# Patient Record
Sex: Female | Born: 1960
Health system: Southern US, Community
[De-identification: ages and names within clinical notes are randomized; demographics above are authoritative.]

## PROBLEM LIST (undated history)

## (undated) DIAGNOSIS — R9431 Abnormal electrocardiogram [ECG] [EKG]: Secondary | ICD-10-CM

## (undated) DIAGNOSIS — K219 Gastro-esophageal reflux disease without esophagitis: Secondary | ICD-10-CM

## (undated) DIAGNOSIS — M8580 Other specified disorders of bone density and structure, unspecified site: Secondary | ICD-10-CM

## (undated) DIAGNOSIS — D1803 Hemangioma of intra-abdominal structures: Secondary | ICD-10-CM

## (undated) DIAGNOSIS — K649 Unspecified hemorrhoids: Secondary | ICD-10-CM

## (undated) DIAGNOSIS — D126 Benign neoplasm of colon, unspecified: Secondary | ICD-10-CM

## (undated) DIAGNOSIS — K625 Hemorrhage of anus and rectum: Secondary | ICD-10-CM

## (undated) DIAGNOSIS — E785 Hyperlipidemia, unspecified: Secondary | ICD-10-CM

## (undated) DIAGNOSIS — T7840XA Allergy, unspecified, initial encounter: Secondary | ICD-10-CM

## (undated) HISTORY — DX: Other specified disorders of bone density and structure, unspecified site: M85.80

## (undated) HISTORY — DX: Hemangioma of intra-abdominal structures: D18.03

## (undated) HISTORY — DX: Hyperlipidemia, unspecified: E78.5

## (undated) HISTORY — PX: TUBAL LIGATION: SHX77

## (undated) HISTORY — DX: Benign neoplasm of colon, unspecified: D12.6

## (undated) HISTORY — DX: Gastro-esophageal reflux disease without esophagitis: K21.9

## (undated) HISTORY — DX: Hemorrhage of anus and rectum: K62.5

## (undated) HISTORY — DX: Allergy, unspecified, initial encounter: T78.40XA

## (undated) HISTORY — DX: Unspecified hemorrhoids: K64.9

## (undated) HISTORY — DX: Abnormal electrocardiogram (ECG) (EKG): R94.31

---

## 2006-04-11 ENCOUNTER — Ambulatory Visit: Payer: Self-pay | Admitting: Internal Medicine

## 2006-04-19 ENCOUNTER — Ambulatory Visit: Payer: Self-pay | Admitting: Internal Medicine

## 2006-10-10 HISTORY — PX: HEMORROIDECTOMY: SUR656

## 2006-10-10 HISTORY — PX: COLONOSCOPY: SHX174

## 2007-07-24 ENCOUNTER — Telehealth: Payer: Self-pay | Admitting: Internal Medicine

## 2007-07-26 ENCOUNTER — Ambulatory Visit: Payer: Self-pay | Admitting: Internal Medicine

## 2007-07-26 DIAGNOSIS — K625 Hemorrhage of anus and rectum: Secondary | ICD-10-CM

## 2007-07-26 DIAGNOSIS — K649 Unspecified hemorrhoids: Secondary | ICD-10-CM

## 2007-07-26 HISTORY — DX: Unspecified hemorrhoids: K64.9

## 2007-07-26 HISTORY — DX: Hemorrhage of anus and rectum: K62.5

## 2007-07-31 ENCOUNTER — Telehealth (INDEPENDENT_AMBULATORY_CARE_PROVIDER_SITE_OTHER): Payer: Self-pay | Admitting: *Deleted

## 2007-08-03 ENCOUNTER — Encounter: Payer: Self-pay | Admitting: Internal Medicine

## 2007-09-04 ENCOUNTER — Ambulatory Visit: Payer: Self-pay | Admitting: Internal Medicine

## 2007-09-04 LAB — CONVERTED CEMR LAB
Crystals: NEGATIVE
Ketones, ur: NEGATIVE mg/dL
Mucus, UA: NEGATIVE
Specific Gravity, Urine: 1.01 (ref 1.000–1.03)
Urine Glucose: NEGATIVE mg/dL
Urobilinogen, UA: 0.2 (ref 0.0–1.0)
WBC, UA: NONE SEEN cells/hpf

## 2007-09-05 ENCOUNTER — Ambulatory Visit: Payer: Self-pay | Admitting: Internal Medicine

## 2007-09-05 ENCOUNTER — Encounter: Payer: Self-pay | Admitting: Internal Medicine

## 2007-09-05 DIAGNOSIS — D126 Benign neoplasm of colon, unspecified: Secondary | ICD-10-CM

## 2007-09-05 HISTORY — DX: Benign neoplasm of colon, unspecified: D12.6

## 2007-09-28 ENCOUNTER — Encounter (INDEPENDENT_AMBULATORY_CARE_PROVIDER_SITE_OTHER): Payer: Self-pay | Admitting: Surgery

## 2007-09-28 ENCOUNTER — Ambulatory Visit (HOSPITAL_COMMUNITY): Admission: RE | Admit: 2007-09-28 | Discharge: 2007-09-28 | Payer: Self-pay | Admitting: Surgery

## 2007-10-31 DIAGNOSIS — D1803 Hemangioma of intra-abdominal structures: Secondary | ICD-10-CM

## 2007-10-31 HISTORY — DX: Hemangioma of intra-abdominal structures: D18.03

## 2011-02-22 NOTE — Op Note (Signed)
NAMEANNETE, AYUSO                 ACCOUNT NO.:  [id number]   MEDICAL RECORD NO.:  [id number]          PATIENT TYPE:  AMB   LOCATION:  SDS                          FACILITY:  [facility]   PHYSICIAN:  Wilmon Arms. Corliss Skains, M.D. DATE OF BIRTH:  [birth year]   DATE OF PROCEDURE:  09/28/2007  DATE OF DISCHARGE:                               OPERATIVE REPORT   PREOPERATIVE DIAGNOSES:  1. External hemorrhoids.  2. Pruritus ani with significant skin excoriation.   POSTOPERATIVE DIAGNOSES:  1. External hemorrhoids.  2. Pruritus ani with significant skin excoriation.   PROCEDURE PERFORMED:  Hemorrhoidectomy, two-column.   SURGEON:  Wilmon Arms. Corliss Skains, M.D., FACS   ANESTHESIA:  General endotracheal.   INDICATIONS:  The patient is a 50 year old female who presents with  several years of significant pruritus and skin irritation around her  anus.  She has had hemorrhoids for many years, which became worse during  pregnancy.  She has had recurrent positive itching and bleeding.  She is  not getting any relief from her topical treatments.  She has had a  colonoscopy, which showed only hemorrhoids.   DESCRIPTION OF PROCEDURE:  The patient was brought to the operating room  and placed in a supine position on the operating table.  After an  adequate level of general anesthesia was obtained, the patient's legs  were placed in the yellow fin stirrups in lithotomy position.  Her  perineum was exposed.  The patient has a significant amount of  excoriation posterior to the anus.  She has a little bit anterior and to  the left of her anus as well.  She has two large, visible external  hemorrhoids.  When is in the right posterior location, the other is in  the left anterior.  Her anus was dilated up to three fingers with a  lubricated glove.  The silver bullet retractor was inserted.  The left  anterior hemorrhoid complex is mostly external hemorrhoid.  However, the  right posterior is an internal-external  complex.  We began on the right.  I placed a suture of 3-0 Vicryl at the apex of the hemorrhoid.  The  mucosa and skin were then incised with a 15 blade.  Blunt dissection was  used to dissect it off of the sphincter muscle.  Cautery was used to  remove the tissue.  Cautery was then used for hemostasis.  I used the 3-  0 Vicryl suture in a running locking fashion to close the mucosa and the  anoderm.  I left the most external 5 mm open to allow drainage.  We then  grasped the left-sided anterior external hemorrhoid with a Pennington  clamp.  I placed a 3-0 Vicryl at the apex.  The anoderm was then incised  with a scalpel.  Cautery was then used to remove this off of the  sphincter muscle.  This was also sent for pathologic examination.  The 3-  0 Vicryl was used to close the anoderm.  I also left an opening to allow  for drainage.  A piece of Gelfoam lubricated with dibucaine ointment was  then inserted into the anal canal.  Hemostasis was good.  The patient was then placed back in the supine  position.  A dry gauze was placed and secured with mesh underwear.  She  was extubated and brought to the recovery room in stable condition.  All  sponge, instrument and needle counts were correct.      Wilmon Arms. Tsuei, M.D.  Electronically Signed     MKT/MEDQ  D:  09/28/2007  T:  09/29/2007  Job:  [id number]

## 2011-02-22 NOTE — Assessment & Plan Note (Signed)
[facility]                         GASTROENTEROLOGY OFFICE NOTE   NAME:Jodi Douglas, Jodi Douglas                          MRN:          [id number]  DATE:09/04/2007                            DOB:          04/02/1961    Jodi Douglas is a 50 year old female who is followed by Dr. Fabian Sharp because  of rectal problems, specifically pruritus, irritation, and a recurrent  yeast infection.  She has been seen by dermatology as well as by Dr.  Fabian Sharp and by her gynecologist.  For years, she has had problems with  rectal irritation and inability to clean well after bowel movements.  Her bowel habits have been regular, 1 bowel movement daily basis, but  she has to use extraordinary care to clean the area round her rectum and  especially in the summer has to use Diflucan on continuous basis to  prevent infections.  She was diagnosed with internal hemorrhoids, and  has had intermittent rectal bleeding.  There is a family history of  colon polyps in a maternal grandmother.  Jodi Douglas had a liver biopsy  in June 2000 in [facility] after abdominal ultrasound showed mass in the  liver.  The aspiration of the mass showed it to be hemangioma.  Her  liver functions have been normal, and she has not had any followup since  that.   MEDICATIONS:  Diflucan currently on hold.  No other medications.   PAST HISTORY:  Significant for C-section.  Operations none.   FAMILY HISTORY:  Positive for colon polyps in grandmother, heart disease  in father.   SOCIAL HISTORY:  Married with 2 children.  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  She does not smoke.  Drinks alcohol  occasionally.   REVIEW OF SYSTEMS:  Essentially negative.   PHYSICAL EXAM:  Blood pressure 100/70, pulse 80 and weight 159.8 pounds.  She appeared healthy, relaxed.  NECK:  Supple without lymphadenopathy.  LUNGS:  Clear to auscultation.  COR:  Normal S1, normal S2.  ABDOMEN:  Soft and nontender with  normoactive bowel sounds.  RECTAL:  On anoscopic exam there were large skin tags at the rectal os,  which were not tender.  Rectal tone was normal.  On anoscopic exam,  there was some bleeding from small mixed hemorrhoids within the anal  canal.  There were also small internal hemorrhoids, which did not bleed  on exam.  They were circumferential.  There was no prolapse of these  hemorrhoids.  There was no particular pain and no fissure found.  Mucosa  of the rectal ampulla was normal.  Stool was Hemoccult negative.  I did  not see any dermatitis around the perianal area as was described on  earlier exam by Dr. Fabian Sharp.  Apparently, the patient had some scaly  pruritic rash around the perianal area, which now has resolved.   IMPRESSION:  1. A 50 year old female with pruritus ani and large external      hemorrhoids with some small internal hemorrhoids, which have been      already treated conservatively with Anusol HC  suppositories.  She      is very uncomfortable with having bowel movements and having to      clean her rectum repeatedly to avoid superinfection.  Apparently,      there is some dermatitis that keeps recurring.  I could not      document it today on my exam.  2. Positive family history of colon polyps.  3. Intermittent rectal bleeding, most likely anal source.   PLAN:  1. Calmoseptine solution to use 2 to 3 times a day.  This is      nonsteroidal containing, and therefore, it would not lead to yeast      superinfection.  2. I think colonoscopy would be needed before considering surgery on      the external hemorrhoids.  I am not sure that they could be taken      out.  She would probably need a surgical consultation for that.      The internal hemorrhoids certainly are not large enough to warrant      banding excision.  3. High fiber diet.  Continued rectal and anal care.  4. Diflucan 100 mg.  I do not see any yeast infection today.  She      feels that it helps her  intermittently, especially in the summer.      We should document the yeast infection with vaginal swabs or rectal      swabs in the future.     Hedwig Morton. Juanda Chance, MD  Electronically Signed    DMB/MedQ  DD: 09/04/2007  DT: 09/04/2007  Job #: [id number]   cc:   Neta Mends. Fabian Sharp, MD

## 2011-02-25 NOTE — Assessment & Plan Note (Signed)
[facility]                              [facility] OFFICE NOTE   NAME:Bohman, Mende                          MRN:          [id number]  DATE:04/19/2006                            DOB:          02-15-1961    CHIEF COMPLAINT:  New patient, Pap, problem with hemorrhoids.   HISTORY OF PRESENT ILLNESS:  Jodi Douglas is a 50 year old ex-smoking, married  white mother of two who comes in today for a first time visit whose previous  care has really been through her GYN, Dr. Arnette Schaumann.  Her mother, Okey Regal  _________ , is a member of our Financial risk analyst.  She has generally been well but  her most problematic thing has been hemorrhoids.  Her GYN has been  following her for this and has had tried a number of different things along  with which she has been getting yeast infections in the perianal area  including itching and irritation.  Last year she was on Diflucan once a  month for up to six months which did help it but after a few months off the  symptoms returned.  Her hemorrhoids are always there, bleed when she wipes  with a tissue at times, but otherwise has no GI symptoms or other systemic  symptoms.   PAST MEDICAL HISTORY:  See database.   SURGERIES:  Had liver biopsy in July of 2007 just recently.  Had evaluation  for abnormal LFTs.  Ultrasound, MRI results showed a hemangioma.  Had been  seeing GI doctor in [facility].  Has not needed to go back, has done quite  well with it.  She is gravida 3, para 2.  Last Pap February 2007.  LMP April 09, 2006.  Last mammogram October 2006.  Unsure when she had last tetanus  shot.  She thinks it has been within 10 years and will check.   MEDICATIONS:  None.   ALLERGIES:  SULFA.   FAMILY HISTORY:  Positive for heart problems in parents, liver cancer in a  grandparent, otherwise noncontributory.   SOCIAL HISTORY:  __________  eight hours of sleep.  She is a Public librarian.  Four years of college.  Two glasses of wine  per week at most.  Used to smoke  when she was a late adolescent, quit at age 30 when she was having children.  Tries to regular exercise.  Does take acidophilus and garlic to try to help  with natural ways to help her hemorrhoids.   REVIEW OF SYSTEMS:  Noted for chest pain, shortness of breath, other GI or  GU problems, otherwise noncontributory.   OBJECTIVE:  VITAL SIGNS:  Height 5 feet 7 inches, weight 156, pulse 66 and  regular, blood pressure 100/60.  GENERAL:  This is a  healthy-appearing middle-aged lady in no acute  distress.  HEENT:  Normocephalic.  TMs are clear.  Eyes:  PERRLA.  Non-icteric.  OP  clear.  Teeth in adequate repair.  NECK:  Without masses, thyromegaly, or bruit.  CHEST:  __________  equal.  CARDIAC:  S1, S2.  No gallops,  murmurs.  Peripheral pulses present without  CCE.  BREASTS:  No nodules or discharge.  Axilla is clear.  ABDOMEN:  Soft.  No organomegaly or guarding.  No rebound.  EXTREMITIES:  Good range of motion.  No focal atrophy.  NEUROLOGIC:  Grossly intact.  RECTAL:  Examination of external anal area shows some perianal erythema  without distinct line and no scaling, but significant white fissuring is  noted in that area along with some very large hemorrhoidal tags without any  associated bleeding present and no specific tenderness or infection or  discharge.   LABORATORIES:  Normal CBC, chemistries.  Cholesterol is 180 with a ratio  3.5.  Liver tests are within normal limits.  TSH is 2.22 and urinalysis  shows just trace protein.   IMPRESSION:  1.  Wellness examination.  In good health overall; she will check on her      tetanus shot.  2.  Hemorrhoidal tags.  3.  Recurrent perianal dermatitis that looks yeast or monilial by      examination today.  We did discuss decreasing caffeine.  Discussed these      two problems how they will aggravate each other, but appear to be      separate.  Will try Diflucan 150 one p.o. every week x3-4, refilled  x3.      Nizoral cream 15 g to use b.i.d. and once subsiding will try maybe      Monistat powder, Burrow's solution if needed for cleaning and drying.      Will also give Anusol H-D suppositories one p.o. b.i.d. #28 for      hemorrhoids.  She will get back with [place] if she is not improving.  4.  History of hemangioma of the liver.  She will try to get [place] those      records so we can review any needed follow-up.                                   Neta Mends. Fabian Sharp, MD   WKP/MedQ  DD:  04/19/2006  DT:  04/20/2006  Job #:  [id number]

## 2011-07-15 LAB — CBC
HCT: 35.1 — ABNORMAL LOW
MCV: 89.6
Platelets: 196
RBC: 3.92
WBC: 6.6

## 2013-05-16 ENCOUNTER — Ambulatory Visit (INDEPENDENT_AMBULATORY_CARE_PROVIDER_SITE_OTHER): Payer: Managed Care, Other (non HMO) | Admitting: Family Medicine

## 2013-05-16 ENCOUNTER — Encounter: Payer: Self-pay | Admitting: Family Medicine

## 2013-05-16 VITALS — BP 112/84 | Temp 98.0°F | Ht 66.25 in | Wt 167.0 lb

## 2013-05-16 DIAGNOSIS — M949 Disorder of cartilage, unspecified: Secondary | ICD-10-CM

## 2013-05-16 DIAGNOSIS — R9431 Abnormal electrocardiogram [ECG] [EKG]: Secondary | ICD-10-CM

## 2013-05-16 DIAGNOSIS — Z7689 Persons encountering health services in other specified circumstances: Secondary | ICD-10-CM

## 2013-05-16 DIAGNOSIS — E785 Hyperlipidemia, unspecified: Secondary | ICD-10-CM | POA: Insufficient documentation

## 2013-05-16 DIAGNOSIS — M8580 Other specified disorders of bone density and structure, unspecified site: Secondary | ICD-10-CM

## 2013-05-16 DIAGNOSIS — M899 Disorder of bone, unspecified: Secondary | ICD-10-CM

## 2013-05-16 DIAGNOSIS — Z7189 Other specified counseling: Secondary | ICD-10-CM

## 2013-05-16 NOTE — Progress Notes (Signed)
Chief Complaint  Patient presents with  . Establish Care    HPI:  Jodi Douglas is here to establish care. Here to re-establish care. Last PCP and physical: Dr. Delford Field in [place] for gyn - had physical last year. Reports UTD on paps. Reports borderline cholesterol problems in the past treated with lifestyle recs. Reports had bone density at [facility] 05/2012 - not sure why - told had osteopenia and put on medication for this but she stopped it. Reports hx of abnormal EKG at physical - not sure what, had echo and stress test which were all normal. Denies any symptoms of CP, SOB, DOE, sudden cardiac death in family, Father had some heart disease later in life.  Has the following chronic problems and concerns today:  Patient Active Problem List   Diagnosis Date Noted  . Osteopenia 05/16/2013  . Nonspecific abnormal electrocardiogram (ECG) (EKG) 05/16/2013  . Hyperlipidemia 05/16/2013   Health Maintenance: -had colonoscopy in 2008 with benign polyp and thinks was due in 5 years - she will call to see when needs repeat -thinks last tetanus > 5 years -reports UTD on pap ROS: See pertinent positives and negatives per HPI.  Past Medical History  Diagnosis Date  . COLONIC POLYPS, HYPERPLASTIC 09/05/2007    Qualifier: Diagnosis of  By: Gwinda Passe RN, Carissa    . HEMORRHOIDS 07/26/2007    Qualifier: Diagnosis of  By: Fabian Sharp MD, Neta Mends   . LIVER HEMANGIOMA 10/31/2007    Qualifier: Diagnosis of  By: Gwinda Passe RN, Cloria Spring    . RECTAL BLEEDING 07/26/2007    Qualifier: Diagnosis of  By: Fabian Sharp MD, Neta Mends   . Osteopenia   . Hyperlipidemia     Family History  Problem Relation Age of Onset  . Heart disease Father   . Arthritis Sister   . Cancer Maternal Grandmother     liver cancer  . Heart disease Paternal Grandfather     History   Social History  . Marital Status: Married    Spouse Name: N/A    Number of Children: N/A  . Years of Education: N/A   Social History Main Topics   . Smoking status: Former Smoker    Types: Cigarettes    Quit date: 10/10/1994  . Smokeless tobacco: None     Comment: remote smoker  . Alcohol Use: Yes     Comment: occ; 2-3 drinks once per week  . Drug Use: None  . Sexually Active: None   Other Topics Concern  . None   Social History Narrative   Work or School: Marine scientist Situation: lives with husband and two children      Spiritual Beliefs: Christian      Lifestyle: jogs or walks 5 days per week; diet is fair - she is working on diet and trying to loose weight             No current outpatient prescriptions on file.  EXAMCeasar Mons Vitals:   05/16/13 1435  BP: 112/84  Temp: 98 F (36.7 C)    Body mass index is 26.74 kg/(m^2).  GENERAL: vitals reviewed and listed above, alert, oriented, appears well hydrated and in no acute distress  HEENT: atraumatic, conjunttiva clear, no obvious abnormalities on inspection of external nose and ears  NECK: no obvious masses on inspection  LUNGS: clear to auscultation bilaterally, no wheezes, rales or rhonchi, good air movement  CV: HRRR, no peripheral edema  MS: moves  all extremities without noticeable abnormality  PSYCH: pleasant and cooperative, no obvious depression or anxiety  ASSESSMENT AND PLAN:  Discussed the following assessment and plan:  Osteopenia -obtain records, vit D, calcium and weight bearing exercise advised, repeat dexa in 2 years  Hyperlipidemia -fasting labs at physical, healthy lifestyle advised  Nonspecific abnormal electrocardiogram (ECG) (EKG) -obtain records  Encounter to establish care  -We reviewed the PMH, PSH, FH, SH, Meds and Allergies. -We provided refills for any medications we will prescribe as needed. -We addressed current concerns per orders and patient instructions. -We have asked for records for pertinent exams, studies, vaccines and notes from previous providers. -We have advised patient to follow up per  instructions below.   -Patient advised to return or notify a doctor immediately if symptoms worsen or persist or new concerns arise.  Patient Instructions  -PLEASE SIGN UP FOR MYCHART TODAY   We recommend the following healthy lifestyle measures: - eat a healthy diet consisting of lots of vegetables, fruits, beans, nuts, seeds, healthy meats such as white chicken and fish and whole grains.  - avoid fried foods, fast food, processed foods, sodas, red meet and other fattening foods.  - get a least 150 minutes of aerobic exercise per week.   -Call about your colonoscopy ([facility] - Dr. Dickie La)   -please have mammogram results sent to [place]  -please have last pap results sent to [place]  -please have vaccine records sent to [place]  -take 1000-2000 IU of Vit D 3 daily and a total of 1200mg  of calcium in food and supplement if needed  Follow up in: follow up in 1-2 months for physical in the morning with labs and pap      Montey Ebel R.

## 2013-05-16 NOTE — Patient Instructions (Addendum)
-  PLEASE SIGN UP FOR MYCHART TODAY   We recommend the following healthy lifestyle measures: - eat a healthy diet consisting of lots of vegetables, fruits, beans, nuts, seeds, healthy meats such as white chicken and fish and whole grains.  - avoid fried foods, fast food, processed foods, sodas, red meet and other fattening foods.  - get a least 150 minutes of aerobic exercise per week.   -Call about your colonoscopy ([facility] - Dr. Dickie La)   -please have mammogram results sent to [place]  -please have last pap results sent to [place]  -please have vaccine records sent to [place]  -take 1000-2000 IU of Vit D 3 daily and a total of 1200mg  of calcium in food and supplement if needed  Follow up in: follow up in 1-2 months for physical in the morning with labs and pap

## 2013-06-20 ENCOUNTER — Encounter: Payer: Self-pay | Admitting: Family Medicine

## 2013-06-20 ENCOUNTER — Other Ambulatory Visit (HOSPITAL_COMMUNITY)
Admission: RE | Admit: 2013-06-20 | Discharge: 2013-06-20 | Disposition: A | Discharge status: Home / Self Care | Payer: Managed Care, Other (non HMO) | Source: Ambulatory Visit | Attending: Family Medicine | Admitting: Family Medicine

## 2013-06-20 ENCOUNTER — Ambulatory Visit (INDEPENDENT_AMBULATORY_CARE_PROVIDER_SITE_OTHER): Payer: Managed Care, Other (non HMO) | Admitting: Family Medicine

## 2013-06-20 VITALS — BP 112/78 | Temp 98.4°F | Ht 66.25 in | Wt 168.0 lb

## 2013-06-20 DIAGNOSIS — Z01419 Encounter for gynecological examination (general) (routine) without abnormal findings: Secondary | ICD-10-CM | POA: Insufficient documentation

## 2013-06-20 DIAGNOSIS — M899 Disorder of bone, unspecified: Secondary | ICD-10-CM

## 2013-06-20 DIAGNOSIS — Z23 Encounter for immunization: Secondary | ICD-10-CM

## 2013-06-20 DIAGNOSIS — M8580 Other specified disorders of bone density and structure, unspecified site: Secondary | ICD-10-CM

## 2013-06-20 DIAGNOSIS — Z1151 Encounter for screening for human papillomavirus (HPV): Secondary | ICD-10-CM | POA: Insufficient documentation

## 2013-06-20 DIAGNOSIS — Z0000 Encounter for general adult medical examination without abnormal findings: Secondary | ICD-10-CM

## 2013-06-20 DIAGNOSIS — K649 Unspecified hemorrhoids: Secondary | ICD-10-CM

## 2013-06-20 DIAGNOSIS — E785 Hyperlipidemia, unspecified: Secondary | ICD-10-CM

## 2013-06-20 LAB — LIPID PANEL
Cholesterol: 182 mg/dL (ref 0–200)
HDL: 52.9 mg/dL (ref >39.00)
Triglycerides: 76 mg/dL (ref 0.0–149.0)

## 2013-06-20 MED ORDER — LIDOCAINE-HYDROCORTISONE ACE 3-1 % RE KIT: 1.0000 "application " | PACK | Freq: Two times a day (BID) | RECTAL | Status: DC

## 2013-06-20 MED ORDER — NYSTATIN-TRIAMCINOLONE 100000-0.1 UNIT/GM-% EX OINT: 1.0000 "application " | TOPICAL_OINTMENT | Freq: Two times a day (BID) | CUTANEOUS | Status: DC

## 2013-06-20 NOTE — Addendum Note (Signed)
Addended by: Earle Gell C on: 06/20/2013 09:00 AM   Modules accepted: Orders

## 2013-06-20 NOTE — Progress Notes (Signed)
Chief Complaint  Patient presents with  . Annual Exam    HPI:  Here for CPE:  -Concerns/Chrinic Problems:  1)Osteopenia: -advised cal, vit D, exercise -last [facility] 05/2012 per pt  2)HLD: -FASTING labs today to evaluate  3)hemorrhoids -uses lidocaine 3% -hydrocortisone 1% cream; needs refill -occ gets yeast infection and needs diflucan -has had hemorroidectomy -still has occ issues  -Diet: variety of foods, balance and well rounded  -Vitamin D/Calcium: advised to take  -Exercise:  regular exercise - walking and jogging 5 days per week  -Diabetes and Dyslipidemia Screening: doing today  -Hx of HTN: no  -Vaccines: needs flu and tdap today today  -pap history: unsure of last one, no hx of abnormal pap  -FDLMP: postmenopausal, no bleeding  -sexual activity: yes, female partner, no new partners  -wants STI testing and hep C: decline  -FH breast, colon or ovarian ca: see FH -last mammo: 10/2012 -last colon cancer screening: colonoscopy 2008 with polyp and recs to repeat in 5 years - reports she called GI and told to get next year  -Alcohol, Tobacco, drug use: see social history  Review of Systems - Review of Systems  Constitutional: Negative for fever, weight loss and malaise/fatigue.  HENT: Negative for hearing loss.   Eyes: Negative for blurred vision.  Respiratory: Negative for shortness of breath.   Cardiovascular: Negative for chest pain, palpitations and leg swelling.  Gastrointestinal: Negative for diarrhea, constipation, blood in stool and melena.  Genitourinary: Negative for dysuria.  Musculoskeletal: Negative for joint pain and falls.  Skin: Negative for rash.  Neurological: Negative for dizziness.  Endo/Heme/Allergies: Does not bruise/bleed easily.  Psychiatric/Behavioral: Negative for depression and suicidal ideas.     Past Medical History  Diagnosis Date  . COLONIC POLYPS, HYPERPLASTIC 09/05/2007    Qualifier: Diagnosis of  By:  Gwinda Passe RN, Carissa    . HEMORRHOIDS 07/26/2007    Qualifier: Diagnosis of  By: Fabian Sharp MD, Neta Mends   . LIVER HEMANGIOMA 10/31/2007    Qualifier: Diagnosis of  By: Gwinda Passe RN, Cloria Spring    . RECTAL BLEEDING 07/26/2007    Qualifier: Diagnosis of  By: Fabian Sharp MD, Neta Mends   . Osteopenia   . Hyperlipidemia     Past Surgical History  Procedure Laterality Date  . Tubal ligation    . Cesarean section      Family History  Problem Relation Age of Onset  . Heart disease Father   . Arthritis Sister   . Cancer Maternal Grandmother     liver cancer  . Heart disease Paternal Grandfather     History   Social History  . Marital Status: Married    Spouse Name: N/A    Number of Children: N/A  . Years of Education: N/A   Social History Main Topics  . Smoking status: Former Smoker    Types: Cigarettes    Quit date: 10/10/1994  . Smokeless tobacco: None     Comment: remote smoker  . Alcohol Use: Yes     Comment: occ; 2-3 drinks once per week  . Drug Use: None  . Sexual Activity: None   Other Topics Concern  . None   Social History Narrative   Work or School: Marine scientist Situation: lives with husband and two children      Spiritual Beliefs: Christian      Lifestyle: jogs or walks 5 days per week; diet is fair - she is working on diet  and trying to loose weight             Current outpatient prescriptions:lidocaine-hydrocortisone (ANAMANTLE) 3-1 % KIT, Place 1 application rectally 2 (two) times daily., Disp: 1 each, Rfl: 1;  nystatin-triamcinolone ointment (MYCOLOG), Apply 1 application topically 2 (two) times daily., Disp: 30 g, Rfl: 1  EXAM:  Filed Vitals:   06/20/13 0812  BP: 112/78  Temp: 98.4 F (36.9 C)    GENERAL: vitals reviewed and listed below, alert, oriented, appears well hydrated and in no acute distress  HEENT: head atraumatic, PERRLA, normal appearance of eyes, ears, nose and mouth. moist mucus membranes.  NECK: supple, no masses or  lymphadenopathy  LUNGS: clear to auscultation bilaterally, no rales, rhonchi or wheeze  CV: HRRR, no peripheral edema or cyanosis, normal pedal pulses  BREAST: normal appearance - no lesions or discharge, on palpation normal breast tissue without any suspicious masses  ABDOMEN: bowel sounds normal, soft, non tender to palpation, no masses, no rebound or guarding  GU: normal appearance of external genitalia - no lesions or masses, normal vaginal mucosa - no abnormal discharge, normal appearance of cervix - no lesions or abnormal discharge, no masses or tenderness on palpation of uterus and ovaries.  RECTAL: refused  SKIN: no rash or abnormal lesions  MS: normal gait, moves all extremities normally  NEURO: CN II-XII grossly intact, normal muscle strength and sensation to light touch on extremities  PSYCH: normal affect, pleasant and cooperative  ASSESSMENT AND PLAN:  Discussed the following assessment and plan:  Visit for preventive health examination - Plan: Lipid Panel, Hemoglobin A1c  Hemorrhoids  Osteopenia  Hyperlipidemia - Plan: Lipid Panel   -Discussed and advised all [place] preventive services health task force level A and B recommendations for age, sex and risks.  -Advised at least 150 minutes of exercise per week and a healthy diet low in saturated fats and sweets and consisting of fresh fruits and vegetables, lean meats such as fish and white chicken and whole grains. Advised weight bearing exercise.  -refilled meds  -tdap, flu given today  -FASTING LABS  -labs, studies and vaccines per orders this encounter  Orders Placed This Encounter  Procedures  . Lipid Panel  . Hemoglobin A1c    Patient Instructions  -1000 IU of vit D 3 daily, 1200mg  of calcium total from food and supplement  We recommend the following healthy lifestyle measures: - eat a healthy diet consisting of lots of vegetables, fruits, beans, nuts, seeds, healthy meats such as white chicken  and fish and whole grains.  - avoid fried foods, fast food, processed foods, sodas, red meet and other fattening foods.  - get a least 150 minutes of aerobic exercise per week.   -We have ordered labs or studies at this visit. It can take up to 1-2 weeks for results and processing. We will contact you with instructions IF your results are abnormal. Normal results will be released to your [facility]. If you have not heard from [place] or can not find your results in [facility] in 2 weeks please contact our office.           Patient advised to return to clinic immediately if symptoms worsen or persist or new concerns.    Return in about 1 year (around 06/20/2014), or if symptoms worsen or fail to improve.  Kriste Basque R.

## 2013-06-20 NOTE — Patient Instructions (Addendum)
-  1000 IU of vit D 3 daily, 1200mg  of calcium total from food and supplement  We recommend the following healthy lifestyle measures: - eat a healthy diet consisting of lots of vegetables, fruits, beans, nuts, seeds, healthy meats such as white chicken and fish and whole grains.  - avoid fried foods, fast food, processed foods, sodas, red meet and other fattening foods.  - get a least 150 minutes of aerobic exercise per week.   -We have ordered labs or studies at this visit. It can take up to 1-2 weeks for results and processing. We will contact you with instructions IF your results are abnormal. Normal results will be released to your [facility]. If you have not heard from [place] or can not find your results in [facility] in 2 weeks please contact our office.

## 2013-06-21 ENCOUNTER — Encounter: Payer: Self-pay | Admitting: Family Medicine

## 2013-07-04 ENCOUNTER — Encounter: Payer: Self-pay | Admitting: Family Medicine

## 2013-07-04 NOTE — Progress Notes (Signed)
Received echo and nuclear cardiac reports from 2013 from [facility]. Placed in scan box.

## 2013-07-16 ENCOUNTER — Encounter: Payer: Self-pay | Admitting: Family Medicine

## 2013-07-17 ENCOUNTER — Encounter: Payer: Self-pay | Admitting: Family Medicine

## 2014-06-18 ENCOUNTER — Encounter: Payer: Self-pay | Admitting: Internal Medicine

## 2014-07-16 ENCOUNTER — Ambulatory Visit (INDEPENDENT_AMBULATORY_CARE_PROVIDER_SITE_OTHER): Payer: Managed Care, Other (non HMO) | Admitting: Family Medicine

## 2014-07-16 ENCOUNTER — Ambulatory Visit: Payer: Managed Care, Other (non HMO) | Admitting: Family Medicine

## 2014-07-16 ENCOUNTER — Encounter: Payer: Self-pay | Admitting: Family Medicine

## 2014-07-16 VITALS — BP 100/74 | HR 88 | Temp 97.8°F | Ht 66.25 in | Wt 177.5 lb

## 2014-07-16 DIAGNOSIS — M25562 Pain in left knee: Secondary | ICD-10-CM

## 2014-07-16 NOTE — Progress Notes (Signed)
Pre visit review using our clinic review tool, if applicable. No additional management support is needed unless otherwise documented below in the visit note. 

## 2014-07-16 NOTE — Patient Instructions (Addendum)
-  please do the exercises provided at least 4-5 days per week  -no strenuous lower body exercise for the next few weeks - walking is ok if not aggravating symptoms --> then GRADUALLY add back gentle stationary bike or walking  -can you aleve twice daily if needed for pain  -ice after any activity that causes pain  -follow up in 3-4 weeks

## 2014-07-16 NOTE — Progress Notes (Signed)
No chief complaint on file.   HPI:  Acute visit for:  L knee Pain: -started a month or two ago after doing a lot of gym and elliptical workouts and walking [place] the beach -has been coming and going since then, had flare 2 weeks ago after twisting when running to mailbox - felt a pop and immediate pain - no swelling -she reports she could not bear weight, she went to urgent care and had neg xray, treated with a steroid -she reports has been doing gentle biking, improved -pain is around the knee cap, mild pain now, constant, worse with stairs, squatting -meloxicam helps -denies: weakness, numbness, popping or giving away, fevers, malaise  ROS: See pertinent positives and negatives per HPI.  Past Medical History  Diagnosis Date  . COLONIC POLYPS, HYPERPLASTIC 09/05/2007    Qualifier: Diagnosis of  By: Arsenio Loader RN, Carissa    . HEMORRHOIDS 07/26/2007    Qualifier: Diagnosis of  By: Regis Bill MD, Standley Brooking   . LIVER HEMANGIOMA 10/31/2007    Qualifier: Diagnosis of  By: Arsenio Loader RN, Ramond Craver    . RECTAL BLEEDING 07/26/2007    Qualifier: Diagnosis of  By: Regis Bill MD, Standley Brooking   . Osteopenia   . Hyperlipidemia     Past Surgical History  Procedure Laterality Date  . Tubal ligation    . Cesarean section      Family History  Problem Relation Age of Onset  . Heart disease Father   . Arthritis Sister   . Cancer Maternal Grandmother     liver cancer  . Heart disease Paternal Grandfather     History   Social History  . Marital Status: Married    Spouse Name: N/A    Number of Children: N/A  . Years of Education: N/A   Social History Main Topics  . Smoking status: Former Smoker    Types: Cigarettes    Quit date: 10/10/1994  . Smokeless tobacco: None     Comment: remote smoker  . Alcohol Use: Yes     Comment: occ; 2-3 drinks once per week  . Drug Use: None  . Sexual Activity: None   Other Topics Concern  . 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  GENERAL: vitals reviewed and listed above, alert, oriented, appears well hydrated and in no acute distress  HEENT: atraumatic, conjunttiva clear, no obvious abnormalities on inspection of external nose and ears  NECK: no obvious masses on inspection  LUNGS: clear to auscultation bilaterally, no wheezes, rales or rhonchi, good air movement  CV: HRRR, no peripheral edema  MS: moves all extremities without noticeable abnormality -gait is normal -no swelling, erythema or obvious deformity of either knee on inspection -on L knee exam, +pat crepitus, + J sign, +TTP around knee cap and medial jt line mild, +single leg squat, some popping with mcmurry but no  pain -neg lachman, neg ant/post drawer, neg val/var stress -NV intact distally  PSYCH: pleasant and cooperative, no obvious depression or anxiety  ASSESSMENT AND PLAN:  Discussed the following assessment and plan:  Left knee pain  -we discussed possible serious and likely etiologies, workup and treatment, treatment risks and return precautions - likely PFS and possible meniscal injury -after this discussion, Taylee opted for HEP, conservative care and close follow up - advised MRI or specialist referral if not improving -follow up advised in 3-4 weeks -of course, we advised Anjelica  to return or notify a doctor immediately if symptoms worsen or persist or new concerns arise.  .  -Patient advised to return or notify a doctor immediately if symptoms worsen or persist or new concerns arise.  Patient Instructions   -please do the exercises provided at least 4-5 days per week  -no strenuous lower body exercise for the next few weeks - walking is ok if not aggravating symptoms --> then GRADUALLY add back gentle stationary bike or walking  -can you aleve twice daily if needed for pain  -ice after any activity that causes pain  -follow up in 3-4 weeks     KIM, HANNAH R.

## 2014-08-07 ENCOUNTER — Encounter: Payer: Self-pay | Admitting: Family Medicine

## 2014-08-07 ENCOUNTER — Ambulatory Visit (INDEPENDENT_AMBULATORY_CARE_PROVIDER_SITE_OTHER): Payer: Managed Care, Other (non HMO) | Admitting: Family Medicine

## 2014-08-07 VITALS — BP 108/82 | HR 65 | Temp 97.9°F | Ht 66.25 in | Wt 174.5 lb

## 2014-08-07 DIAGNOSIS — M25562 Pain in left knee: Secondary | ICD-10-CM

## 2014-08-07 NOTE — Progress Notes (Signed)
Pre visit review using our clinic review tool, if applicable. No additional management support is needed unless otherwise documented below in the visit note. 

## 2014-08-07 NOTE — Patient Instructions (Signed)
Continue the exercises at least 3-4 days per week  Gradually add back exercise  Return as needed for the knee injury

## 2014-08-07 NOTE — Progress Notes (Signed)
No chief complaint on file.   HPI:  Follow up:  L knee pain: Started about 6 weeks ago after injury running to mailbox and after increasing exercise Suspected PFS and possible meniscal injury and she opted for trial conservative tx Reports: feels like exercises have really helped, easing back into activity - did 2 miles yesterday and did fine Denies: weakness, giving away, swelling, weakness  ROS: See pertinent positives and negatives per HPI.  Past Medical History  Diagnosis Date  . COLONIC POLYPS, HYPERPLASTIC 09/05/2007    Qualifier: Diagnosis of  By: Arsenio Loader RN, Carissa    . HEMORRHOIDS 07/26/2007    Qualifier: Diagnosis of  By: Regis Bill MD, Standley Brooking   . LIVER HEMANGIOMA 10/31/2007    Qualifier: Diagnosis of  By: Arsenio Loader RN, Ramond Craver    . RECTAL BLEEDING 07/26/2007    Qualifier: Diagnosis of  By: Regis Bill MD, Standley Brooking   . Osteopenia   . Hyperlipidemia     Past Surgical History  Procedure Laterality Date  . Tubal ligation    . Cesarean section      Family History  Problem Relation Age of Onset  . Heart disease Father   . Arthritis Sister   . Cancer Maternal Grandmother     liver cancer  . Heart disease Paternal Grandfather     History   Social History  . Marital Status: Married    Spouse Name: N/A    Number of Children: N/A  . Years of Education: N/A   Social History Main Topics  . Smoking status: Former Smoker    Types: Cigarettes    Quit date: 10/10/1994  . Smokeless tobacco: None     Comment: remote smoker  . Alcohol Use: Yes     Comment: occ; 2-3 drinks once per week  . Drug Use: None  . Sexual Activity: None   Other Topics Concern  . 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 , Disp: , Rfl:   EXAM:  Filed Vitals:   08/07/14 1300  BP: 108/82  Pulse: 65  Temp: 97.9 F (36.6 C)    Body mass index is 27.94 kg/(m^2).  GENERAL: vitals reviewed and listed above, alert, oriented, appears well hydrated and in no acute distress  HEENT: atraumatic, conjunttiva clear, no obvious abnormalities on inspection of external nose and ears  NECK: no obvious masses on inspection  KNEE: L knee without effusion or swelling, no TTP, single leg squat improving  MS: moves all extremities without noticeable abnormality  PSYCH: pleasant and cooperative, no obvious depression or anxiety  ASSESSMENT AND PLAN:  Discussed the following assessment and plan:  Left knee pain  -doing much better -continue HEP, follow up as needed -Patient advised to return or notify a doctor immediately if symptoms worsen or persist or new concerns arise.  There are no Patient Instructions on file for this visit.   Colin Benton R.

## 2014-09-10 ENCOUNTER — Encounter: Payer: Self-pay | Admitting: Family Medicine

## 2014-09-10 ENCOUNTER — Ambulatory Visit (INDEPENDENT_AMBULATORY_CARE_PROVIDER_SITE_OTHER): Payer: Managed Care, Other (non HMO) | Admitting: Family Medicine

## 2014-09-10 VITALS — BP 102/76 | HR 73 | Temp 97.9°F | Ht 67.75 in | Wt 172.5 lb

## 2014-09-10 DIAGNOSIS — Z0000 Encounter for general adult medical examination without abnormal findings: Secondary | ICD-10-CM

## 2014-09-10 LAB — HEMOGLOBIN A1C: Hgb A1c MFr Bld: 5.8 % (ref 4.6–6.5)

## 2014-09-10 NOTE — Progress Notes (Signed)
HPI:  Here for CPE:  -Concerns and/or follow up today: none  She reports she is doing well. We did receive multiple prior records which we reviewed. She reports hx of incidental finding of abnormal EKG in the past at a physical and eval with cardiologist with echo and myocardial perfusion scan. Reports cardiologist informed her everything looked ok and she doe snot take any medications for her heart. She denies current or past hx of CP, SOB, DOE, chest discomfort with activity, palpitations, exercise intolerance. On review of records normal echo, mild abnormality on myocardial perfusion scan.  -Diet: variety of foods, balance and well rounded  -Exercise: regular exercise - 3-4 miles walking 3-5 days per week and goes to the gym on saturdays  -vitamin D or calcium:  no  -Diabetes and Dyslipidemia Screening: done last year with very mild prediabetes and mildly elevated LDL  -Hx of HTN: no  -Vaccines: UTD  -pap history: normal pap last year  -FDLMP: n/a  -sexual activity: yes, female partner, no new partners  -wants STI testing: no  -FH breast, colon or ovarian ca: see FH Last mammogram: last year Last colon cancer screening: 2008, recs per letter from GI reviewed in chart that she is to repeat in 2018  -Alcohol, Tobacco, drug use: see social history  Review of Systems - no fevers, unintentional weight loss, vision loss, hearing loss, chest pain, sob, hemoptysis, melena, hematochezia, hematuria, genital discharge, changing or concerning skin lesions, bleeding, bruising, loc, thoughts of self harm or SI  Past Medical History  Diagnosis Date  . COLONIC POLYPS, HYPERPLASTIC 09/05/2007    Qualifier: Diagnosis of  By: Arsenio Loader RN, Carissa    . HEMORRHOIDS 07/26/2007    Qualifier: Diagnosis of  By: Regis Bill MD, Standley Brooking   . LIVER HEMANGIOMA 10/31/2007    Qualifier: Diagnosis of  By: Arsenio Loader RN, Ramond Craver    . RECTAL BLEEDING 07/26/2007    Qualifier: Diagnosis of  By: Regis Bill MD, Standley Brooking    . Osteopenia   . Hyperlipidemia     Past Surgical History  Procedure Laterality Date  . Tubal ligation    . Cesarean section      Family History  Problem Relation Age of Onset  . Heart disease Father   . Arthritis Sister   . Cancer Maternal Grandmother     liver cancer  . Heart disease Paternal Grandfather     History   Social History  . Marital Status: Married    Spouse Name: N/A    Number of Children: N/A  . Years of Education: N/A   Social History Main Topics  . Smoking status: Former Smoker    Types: Cigarettes    Quit date: 10/10/1994  . Smokeless tobacco: None     Comment: remote smoker  . Alcohol Use: Yes     Comment: occ; 2-3 drinks once per week  . Drug Use: None  . Sexual Activity: None   Other Topics Concern  . 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 , Disp: , Rfl:  EXAM:  Filed Vitals:   09/10/14 0931  BP: 102/76  Pulse: 73  Temp: 97.9 F (36.6 C)    GENERAL: vitals reviewed and listed below, alert, oriented, appears well hydrated and in no acute distress  HEENT: head atraumatic, PERRLA, normal appearance of eyes, ears, nose and mouth. moist mucus membranes.  NECK: supple, no masses or lymphadenopathy  LUNGS: clear to auscultation bilaterally, no rales, rhonchi or wheeze  CV: HRRR, no peripheral edema or cyanosis, normal pedal pulses  BREAST: normal appearance - no lesions or discharge, on palpation normal breast tissue without any suspicious masses  ABDOMEN: bowel sounds normal, soft, non tender to palpation, no masses, no rebound or guarding  GU: normal appearance of external genitalia - no lesions or  masses, normal vaginal mucosa - no abnormal discharge, normal appearance of cervix - no lesions or abnormal discharge, no masses or tenderness on palpation of uterus and ovaries.  RECTAL: refused  SKIN: no rash or abnormal lesions  MS: normal gait, moves all extremities normally  NEURO: CN II-XII grossly intact, normal muscle strength and sensation to light touch on extremities  PSYCH: normal affect, pleasant and cooperative  ASSESSMENT AND PLAN:  Discussed the following assessment and plan:  There are no diagnoses linked to this encounter.  -Discussed and advised all [place] preventive services health task force level A and B recommendations for age, sex and risks.  -discussed importance of lowering CV risks, healthy diet and regular exercise and monitoring lipids, BP and diabetes screening. Labs today. Discussed risks/benefits asa and she may consider baby asa daily.   -Advised at least 150 minutes of exercise per week and a healthy diet low in saturated fats and sweets and consisting of fresh fruits and vegetables, lean meats such as fish and white chicken and whole grains.  -FASTING labs, studies and vaccines per orders this encounter  Orders Placed This Encounter  Procedures  . Lipid Panel  . Hemoglobin A1c    Patient advised to return to clinic immediately if symptoms worsen or persist or new concerns.  Patient Instructions  -[phone] IU of Vit 3 daily and adequate calcium in diet (1200mg  daily)  -mammogram yearly  We recommend the following healthy lifestyle measures: - eat a healthy diet consisting of lots of vegetables, fruits, beans, nuts, seeds, healthy meats such as white chicken and fish and whole grains.  - avoid fried foods, fast food, processed foods, sodas, red meet and other fattening foods.  - get a least 150 minutes of aerobic exercise per week.      No Follow-up on file.  Colin Benton R.

## 2014-09-10 NOTE — Patient Instructions (Signed)
-  [phone] IU of Vit 3 daily and adequate calcium in diet (1200mg  daily)  -mammogram yearly  We recommend the following healthy lifestyle measures: - eat a healthy diet consisting of lots of vegetables, fruits, beans, nuts, seeds, healthy meats such as white chicken and fish and whole grains.  - avoid fried foods, fast food, processed foods, sodas, red meet and other fattening foods.  - get a least 150 minutes of aerobic exercise per week.

## 2014-09-10 NOTE — Progress Notes (Signed)
Pre visit review using our clinic review tool, if applicable. No additional management support is needed unless otherwise documented below in the visit note. 

## 2014-09-11 LAB — LIPID PANEL
Cholesterol: 219 mg/dL — ABNORMAL HIGH (ref 0–200)
HDL: 51.3 mg/dL (ref >39.00)
LDL Cholesterol: 154 mg/dL — ABNORMAL HIGH (ref 0–99)
NONHDL: 167.7
TRIGLYCERIDES: 70 mg/dL (ref 0.0–149.0)
Total CHOL/HDL Ratio: 4
VLDL: 14 mg/dL (ref 0.0–40.0)

## 2014-12-24 ENCOUNTER — Encounter: Payer: Self-pay | Admitting: Family Medicine

## 2014-12-24 ENCOUNTER — Ambulatory Visit (INDEPENDENT_AMBULATORY_CARE_PROVIDER_SITE_OTHER): Payer: BLUE CROSS/BLUE SHIELD | Admitting: Family Medicine

## 2014-12-24 VITALS — BP 104/80 | HR 69 | Temp 97.9°F | Ht 67.75 in | Wt 166.2 lb

## 2014-12-24 DIAGNOSIS — L609 Nail disorder, unspecified: Secondary | ICD-10-CM

## 2014-12-24 DIAGNOSIS — E785 Hyperlipidemia, unspecified: Secondary | ICD-10-CM

## 2014-12-24 DIAGNOSIS — E663 Overweight: Secondary | ICD-10-CM

## 2014-12-24 DIAGNOSIS — L608 Other nail disorders: Secondary | ICD-10-CM

## 2014-12-24 LAB — LIPID PANEL
CHOL/HDL RATIO: 3
Cholesterol: 179 mg/dL (ref 0–200)
HDL: 56.5 mg/dL (ref >39.00)
LDL Cholesterol: 110 mg/dL — ABNORMAL HIGH (ref 0–99)
NonHDL: 122.5
Triglycerides: 65 mg/dL (ref 0.0–149.0)
VLDL: 13 mg/dL (ref 0.0–40.0)

## 2014-12-24 NOTE — Progress Notes (Signed)
HPI:  HM: -hiv  HLD/Prediabetes: -advised lifestyle recs and repeat lipids today -going to the gym and eating healthy - on 56 day plan -denies: intol to statin, polyuira, polydipsia  Friable tonail: -R great toenail deformity for a few months -denies other nail or skin lesions   ROS: See pertinent positives and negatives per HPI.  Past Medical History  Diagnosis Date  . COLONIC POLYPS, HYPERPLASTIC 09/05/2007    Qualifier: Diagnosis of  By: Arsenio Loader RN, Carissa    . HEMORRHOIDS 07/26/2007    Qualifier: Diagnosis of  By: Regis Bill MD, Standley Brooking   . LIVER HEMANGIOMA 10/31/2007    Qualifier: Diagnosis of  By: Arsenio Loader RN, Ramond Craver    . RECTAL BLEEDING 07/26/2007    Qualifier: Diagnosis of  By: Regis Bill MD, Standley Brooking   . Osteopenia   . Hyperlipidemia     Past Surgical History  Procedure Laterality Date  . Tubal ligation    . Cesarean section      Family History  Problem Relation Age of Onset  . Heart disease Father   . Arthritis Sister   . Cancer Maternal Grandmother     liver cancer  . Heart disease Paternal Grandfather     History   Social History  . Marital Status: Married    Spouse Name: N/A  . Number of Children: N/A  . Years of Education: N/A   Social History Main Topics  . Smoking status: Former Smoker    Types: Cigarettes    Quit date: 10/10/1994  . Smokeless tobacco: Not on file     Comment: remote smoker  . Alcohol Use: Yes     Comment: occ; 2-3 drinks once per week  . Drug Use: Not on file  . Sexual Activity: Not on file   Other Topics Concern  . 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  CALCIUM PO, Take by mouth., Disp: , Rfl:  .  Cholecalciferol (VITAMIN D PO),  Take by mouth., Disp: , Rfl:  .  Fish Oil OIL, by Does not apply route., Disp: , Rfl:  .  meloxicam (MOBIC) 15 MG tablet, daily as needed. , Disp: , Rfl:   EXAM:  Filed Vitals:   12/24/14 0806  BP: 104/80  Pulse: 69  Temp: 97.9 F (36.6 C)    Body mass index is 25.45 kg/(m^2).  GENERAL: vitals reviewed and listed above, alert, oriented, appears well hydrated and in no acute distress  HEENT: atraumatic, conjunttiva clear, no obvious abnormalities on inspection of external nose and ears  NECK: no obvious masses on inspection  LUNGS: clear to auscultation bilaterally, no wheezes, rales or rhonchi, good air movement  CV: HRRR, no peripheral edema  SKIN: sl discoloration of R great toenail  MS: moves all extremities without noticeable abnormality  PSYCH: pleasant and cooperative, no obvious depression or anxiety  ASSESSMENT AND PLAN:  Discussed the following assessment and plan:  Hyperlipemia - Plan: Lipid Panel  Overweight  Toenail deformity  -lifestyle recs - congratulated on changes -discussed causes benign and serious of toenail lesions - likely from  shoe pressure, less likely fungal -she opted for change in shoes and topical treatment -Patient advised to return or notify a doctor immediately if symptoms worsen or persist or new concerns arise.  There are no Patient Instructions on file for this visit.   Colin Benton R.

## 2014-12-24 NOTE — Progress Notes (Signed)
Pre visit review using our clinic review tool, if applicable. No additional management support is needed unless otherwise documented below in the visit note. 

## 2015-03-02 ENCOUNTER — Encounter: Payer: Self-pay | Admitting: *Deleted

## 2015-11-20 ENCOUNTER — Encounter: Payer: Self-pay | Admitting: Family Medicine

## 2015-11-20 ENCOUNTER — Ambulatory Visit (INDEPENDENT_AMBULATORY_CARE_PROVIDER_SITE_OTHER): Payer: BLUE CROSS/BLUE SHIELD | Admitting: Family Medicine

## 2015-11-20 VITALS — BP 102/80 | HR 76 | Temp 97.4°F | Ht 66.75 in | Wt 167.4 lb

## 2015-11-20 DIAGNOSIS — Z0000 Encounter for general adult medical examination without abnormal findings: Secondary | ICD-10-CM | POA: Diagnosis not present

## 2015-11-20 DIAGNOSIS — M8580 Other specified disorders of bone density and structure, unspecified site: Secondary | ICD-10-CM

## 2015-11-20 LAB — LIPID PANEL
Cholesterol: 208 mg/dL — ABNORMAL HIGH (ref 0–200)
HDL: 63.7 mg/dL (ref >39.00)
LDL CALC: 130 mg/dL — AB (ref 0–99)
NonHDL: 143.96
Total CHOL/HDL Ratio: 3
Triglycerides: 69 mg/dL (ref 0.0–149.0)
VLDL: 13.8 mg/dL (ref 0.0–40.0)

## 2015-11-20 LAB — HEMOGLOBIN A1C: Hgb A1c MFr Bld: 5.6 % (ref 4.6–6.5)

## 2015-11-20 NOTE — Progress Notes (Signed)
HPI:  Here for CPE:  -Concerns and/or follow up today:   HLD/Prediabetes: -advised lifestyle recs = -going to the gym and eating healthy  -denies: intol to statin, polyuira, polydipsia  -Diet: variety of foods, balance and well rounded - thought sweets is her weakness  -Exercise:regular exercise  -Taking folic acid, vitamin D or calcium: yes - take vit D  -Diabetes and Dyslipidemia Screening: FASTING for labs  -Hx of HTN: no  -Vaccines: UTD  -pap history: 06/2013, normal - all normal in the past per her report, declines today, agrees to do next year  -FDLMP:n/a  -sexual activity: yes, female partner, no new partners  -wants STI testing (Hep C if born [birth year]): no  -FH breast, colon or ovarian ca: see FH Last mammogram: next week Last colon cancer screening: due in Nov 2018 per letter from [facility]  -Alcohol, Tobacco, drug use: see social history  Review of Systems - no fevers, unintentional weight loss, vision loss, hearing loss, chest pain, sob, hemoptysis, melena, hematochezia, hematuria, genital discharge, changing or concerning skin lesions, bleeding, bruising, loc, thoughts of self harm or SI  Past Medical History  Diagnosis Date  . COLONIC POLYPS, HYPERPLASTIC 09/05/2007    Qualifier: Diagnosis of  By: Arsenio Loader RN, Carissa    . HEMORRHOIDS 07/26/2007    Qualifier: Diagnosis of  By: Regis Bill MD, Standley Brooking   . LIVER HEMANGIOMA 10/31/2007    Qualifier: Diagnosis of  By: Arsenio Loader RN, Ramond Craver    . RECTAL BLEEDING 07/26/2007    Qualifier: Diagnosis of  By: Regis Bill MD, Standley Brooking   . Osteopenia   . Hyperlipidemia   . Abnormal EKG     had extensive negative evaluation with cardiologist     Past Surgical History  Procedure Laterality Date  . Tubal ligation    . Cesarean section      Family History  Problem Relation Age of Onset  . Heart disease Father   . Arthritis Sister   . Cancer Maternal Grandmother     liver cancer  . Heart disease Paternal  Grandfather     Social History   Social History  . Marital Status: Married    Spouse Name: N/A  . Number of Children: N/A  . Years of Education: N/A   Social History Main Topics  . Smoking status: Former Smoker    Types: Cigarettes    Quit date: 10/10/1994  . Smokeless tobacco: None     Comment: remote smoker  . Alcohol Use: Yes     Comment: occ; 2-3 drinks once per week  . Drug Use: None  . Sexual Activity: Not Asked   Other Topics Concern  . 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  CALCIUM PO, Take by mouth., Disp: , Rfl:  .  Cholecalciferol (VITAMIN D PO), Take by mouth., Disp: , Rfl:  .  Fish Oil OIL, by Does not apply route., Disp: , Rfl:   EXAM:  Filed Vitals:   11/20/15 0821  BP: 102/80  Pulse: 76  Temp: 97.4 F (36.3 C)  GENERAL: vitals reviewed and listed below, alert, oriented, appears well hydrated and in no acute distress  HEENT: head atraumatic, PERRLA, normal appearance of eyes, ears, nose and mouth. moist mucus membranes.  NECK: supple, no masses or lymphadenopathy  LUNGS: clear to auscultation bilaterally, no rales, rhonchi or wheeze  CV: HRRR, no peripheral edema or cyanosis, normal pedal pulses  BREAST: declined  ABDOMEN: bowel sounds normal, soft, non tender to palpation, no masses, no rebound or guarding  GU: declined  SKIN: no rash or abnormal lesions - declined full skin chaeck  MS: normal gait, moves all extremities normally  NEURO: CN II-XII grossly intact, normal muscle strength and sensation to light touch on extremities  PSYCH: normal affect, pleasant and cooperative  ASSESSMENT AND PLAN:  Discussed the following assessment and  plan:  Physical exam - Plan: Lipid Panel, Hemoglobin A1c  Osteopenia  -Discussed and advised all [place] preventive services health task force level A and B recommendations for age, sex and risks.  -Advised at least 150 minutes of exercise per week and a healthy diet low in saturated fats and sweets and consisting of fresh fruits and vegetables, lean meats such as fish and white chicken and whole grains.  -declined pap this year - agreed to do next year  -opted against repeat bone density as is not interested in medications and is doing D3 and exercise  -FASTING labs, studies and vaccines per orders this encounter  Orders Placed This Encounter  Procedures  . Lipid Panel  . Hemoglobin A1c    Patient advised to return to clinic immediately if symptoms worsen or persist or new concerns.  Patient Instructions  BEFORE YOU LEAVE: -labs -Physical with Pap in 1 year  -We have ordered labs or studies at this visit. It can take up to 1-2 weeks for results and processing. We will contact you with instructions IF your results are abnormal. Normal results will be released to your [facility]. If you have not heard from [place] or can not find your results in [facility] in 2 weeks please contact our office.  We recommend the following healthy lifestyle measures: - eat a healthy whole foods diet consisting of regular small meals composed of vegetables, fruits, beans, nuts, seeds, healthy meats such as white chicken and fish and whole grains.  - avoid sweets, white starchy foods, fried foods, fast food, processed foods, sodas, red meet and other fattening foods.  - get a least 150-300 minutes of aerobic exercise per week.           No Follow-up on file.  Colin Benton R.

## 2015-11-20 NOTE — Patient Instructions (Signed)
BEFORE YOU LEAVE: -labs -Physical with Pap in 1 year  -We have ordered labs or studies at this visit. It can take up to 1-2 weeks for results and processing. We will contact you with instructions IF your results are abnormal. Normal results will be released to your [facility]. If you have not heard from [place] or can not find your results in [facility] in 2 weeks please contact our office.  We recommend the following healthy lifestyle measures: - eat a healthy whole foods diet consisting of regular small meals composed of vegetables, fruits, beans, nuts, seeds, healthy meats such as white chicken and fish and whole grains.  - avoid sweets, white starchy foods, fried foods, fast food, processed foods, sodas, red meet and other fattening foods.  - get a least 150-300 minutes of aerobic exercise per week.

## 2015-11-20 NOTE — Progress Notes (Signed)
Pre visit review using our clinic review tool, if applicable. No additional management support is needed unless otherwise documented below in the visit note. 

## 2016-11-22 ENCOUNTER — Encounter: Payer: BLUE CROSS/BLUE SHIELD | Admitting: Family Medicine

## 2016-11-24 NOTE — Progress Notes (Signed)
HPI:  Here for CPE:  Concerns and/or follow up today: PMH sig for osteopenia, HLD, prediabetes. Reports had dexa in last 5 years elsewhere and mildly abnormal. Gets a lot of exercise. Eating healthy. Not taking vit d on regular bases. Declines dexa today - agrees to consider next year. Has mammo scheduled next week.  -Diet: variety of foods, balance and well rounded  -Exercise: regular exercise  -Taking folic acid, vitamin D or calcium: no  -Diabetes and Dyslipidemia Screening: fasting for labs  -Vaccines: UTD  -pap history: normal 2014 per hx - refused last year; agrees to do today, reports all normal in the past  -FDLMP:n/a  -sexual activity: yes, female partner, no new partners  -wants STI testing (Hep C if born [birth year]): no  -FH breast, colon or ovarian ca: see FH Last mammogram: due Last colon cancer screening: due Nov 2018   -Alcohol, Tobacco, drug use: see social history  Review of Systems - no fevers, unintentional weight loss, vision loss, hearing loss, chest pain, sob, hemoptysis, melena, hematochezia, hematuria, genital discharge, changing or concerning skin lesions, bleeding, bruising, loc, thoughts of self harm or SI  Past Medical History:  Diagnosis Date  . Abnormal EKG    had extensive negative evaluation with cardiologist   . COLONIC POLYPS, HYPERPLASTIC 09/05/2007   Qualifier: Diagnosis of  By: Arsenio Loader RN, Carissa    . HEMORRHOIDS 07/26/2007   Qualifier: Diagnosis of  By: Regis Bill MD, Standley Brooking   . Hyperlipidemia   . LIVER HEMANGIOMA 10/31/2007   Qualifier: Diagnosis of  By: Arsenio Loader RN, Ramond Craver    . Osteopenia   . RECTAL BLEEDING 07/26/2007   Qualifier: Diagnosis of  By: Regis Bill MD, Standley Brooking     Past Surgical History:  Procedure Laterality Date  . CESAREAN SECTION    . TUBAL LIGATION      Family History  Problem Relation Age of Onset  . Heart disease Father   . Arthritis Sister   . Cancer Maternal Grandmother     liver cancer  . Heart disease  Paternal Grandfather     Social History   Social History  . Marital status: Married    Spouse name: N/A  . Number of children: N/A  . Years of education: N/A   Social History Main Topics  . Smoking status: Former Smoker    Types: Cigarettes    Quit date: 10/10/1994  . Smokeless tobacco: Never Used     Comment: remote smoker  . Alcohol use Yes     Comment: occ; 2-3 drinks once per week  . Drug use: Unknown  . Sexual activity: Not Asked   Other Topics Concern  . 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  EXAM:  Vitals:   11/25/16 0809  BP: 98/72  Pulse: 76  Temp: 98.4 F (36.9 C)  Body mass index is 26.91 kg/m.   GENERAL: vitals reviewed and listed below, alert, oriented, appears well hydrated and in no acute distress  HEENT: head atraumatic, PERRLA, normal appearance of eyes, ears, nose and mouth. moist mucus membranes.  NECK: supple, no masses or lymphadenopathy  LUNGS: clear to auscultation bilaterally, no rales, rhonchi or wheeze  CV: HRRR, no peripheral edema or cyanosis, normal pedal pulses  ABDOMEN: bowel sounds normal, soft, non tender to palpation, no masses, no rebound or guarding  BREAST: normal appearance - no skin lesions or discharge noted on inspection of both breasts, on palpation of both breast and axillary region no suspicious lesions appreciated today  GU: normal appearance of external genitalia - no lesions or masses appreciated, normal appearing vaginal mucosa - no abnormal discharge, normal appearance of cervix - no lesions or abnormal discharge observed. Mildly stenotic cervix. Pap obtained.  RECTAL: deferred  SKIN: no rash or abnormal lesions  MS:  normal gait, moves all extremities normally  NEURO: normal gait, speech and thought processing grossly intact, muscle tone grossly intact throughout  PSYCH: normal affect, pleasant and cooperative  ASSESSMENT AND PLAN:  Discussed the following assessment and plan:  Encounter for preventive health examination - Plan: Lipid panel, Hemoglobin A1c, Hepatitis C antibody  Osteopenia, unspecified location  Hyperlipidemia, unspecified hyperlipidemia type  Cervical cancer screening - Plan: PAP [[place]]  -she refused dexa, agrees to start daily Vit D, agrees to consider next year   -Discussed and advised all [place] preventive services health task force level A and B recommendations for age, sex and risks.  -Advised at least 150 minutes of exercise per week and a healthy diet with avoidance of (less then 1 serving per week) processed foods, white starches, red meat, fast foods and sweets and consisting of: * 5-9 servings of fresh fruits and vegetables (not corn or potatoes) *nuts and seeds, beans *olives and olive oil *lean meats such as fish and white chicken  *whole grains  -labs, studies and vaccines per orders this encounter  Orders Placed This Encounter  Procedures  . Lipid panel  . Hemoglobin A1c  . Hepatitis C antibody    Patient advised to return to clinic immediately if symptoms worsen or persist or new concerns.  Patient Instructions  BEFORE YOU LEAVE: -follow up: yearly for CPE and as needed -labs  Please get your mammogram as planned.  You are due for your colonoscopy in November of 2018  We have ordered labs or studies at this visit. It can take up to 1-2 weeks for results and processing. IF results require follow up or explanation, we will call you with instructions. Clinically stable results will be released to your [facility]. If you have not heard from [place] or cannot find your results in [facility] in 2 weeks please contact our office at [phone].   If you are  not yet signed up for [facility], please SIGN UP TODAY. We now offer online scheduling, same day appointments and extended hours. WHEN YOU DON'T FEEL YOUR BEST.Marland KitchenMarland KitchenWE ARE HERE TO HELP.   We recommend the following healthy lifestyle for LIFE: 1) Small portions.   Tip: eat off of a salad plate instead of a dinner plate.  Tip: if you need more or a snack choose fruits, veggies and/or a handful of nuts or seeds.  2) Eat a healthy clean diet.  * Tip: Avoid (less then 1 serving per week): processed foods, sweets, sweetened drinks, white starches (rice, flour, bread, potatoes, pasta, etc), red meat, fast foods, butter  *Tip: CHOOSE instead   * 5-9 servings per day of fresh or frozen fruits and vegetables (but not corn, potatoes, bananas, canned or dried fruit)   *nuts and seeds, beans   *olives  and olive oil   *small portions of lean meats such as fish and white chicken    *small portions of whole grains  3)Get at least 150 minutes of sweaty aerobic exercise per week.  4)Reduce stress - consider counseling, meditation and relaxation to balance other aspects of your life.           No Follow-up on file.  Colin Benton R., DO

## 2016-11-25 ENCOUNTER — Ambulatory Visit (INDEPENDENT_AMBULATORY_CARE_PROVIDER_SITE_OTHER): Payer: BLUE CROSS/BLUE SHIELD | Admitting: Family Medicine

## 2016-11-25 ENCOUNTER — Encounter: Payer: BLUE CROSS/BLUE SHIELD | Admitting: Family Medicine

## 2016-11-25 ENCOUNTER — Other Ambulatory Visit (HOSPITAL_COMMUNITY)
Admission: RE | Admit: 2016-11-25 | Discharge: 2016-11-25 | Disposition: A | Discharge status: Home / Self Care | Payer: BLUE CROSS/BLUE SHIELD | Source: Ambulatory Visit | Attending: Family Medicine | Admitting: Family Medicine

## 2016-11-25 ENCOUNTER — Encounter: Payer: Self-pay | Admitting: Family Medicine

## 2016-11-25 VITALS — BP 98/72 | HR 76 | Temp 98.4°F | Ht 66.25 in | Wt 168.0 lb

## 2016-11-25 DIAGNOSIS — Z124 Encounter for screening for malignant neoplasm of cervix: Secondary | ICD-10-CM | POA: Diagnosis not present

## 2016-11-25 DIAGNOSIS — M8580 Other specified disorders of bone density and structure, unspecified site: Secondary | ICD-10-CM | POA: Diagnosis not present

## 2016-11-25 DIAGNOSIS — Z0000 Encounter for general adult medical examination without abnormal findings: Secondary | ICD-10-CM

## 2016-11-25 DIAGNOSIS — E785 Hyperlipidemia, unspecified: Secondary | ICD-10-CM | POA: Diagnosis not present

## 2016-11-25 DIAGNOSIS — Z01419 Encounter for gynecological examination (general) (routine) without abnormal findings: Secondary | ICD-10-CM | POA: Insufficient documentation

## 2016-11-25 DIAGNOSIS — Z1151 Encounter for screening for human papillomavirus (HPV): Secondary | ICD-10-CM | POA: Insufficient documentation

## 2016-11-25 LAB — LIPID PANEL
Cholesterol: 199 mg/dL (ref 0–200)
HDL: 56.8 mg/dL (ref >39.00)
LDL Cholesterol: 127 mg/dL — ABNORMAL HIGH (ref 0–99)
NONHDL: 141.76
Total CHOL/HDL Ratio: 3
Triglycerides: 76 mg/dL (ref 0.0–149.0)
VLDL: 15.2 mg/dL (ref 0.0–40.0)

## 2016-11-25 LAB — HEMOGLOBIN A1C: HEMOGLOBIN A1C: 5.6 % (ref 4.6–6.5)

## 2016-11-25 NOTE — Progress Notes (Signed)
Pre visit review using our clinic review tool, if applicable. No additional management support is needed unless otherwise documented below in the visit note. 

## 2016-11-25 NOTE — Patient Instructions (Signed)
BEFORE YOU LEAVE: -follow up: yearly for CPE and as needed -labs  Please get your mammogram as planned.  You are due for your colonoscopy in November of 2018  We have ordered labs or studies at this visit. It can take up to 1-2 weeks for results and processing. IF results require follow up or explanation, we will call you with instructions. Clinically stable results will be released to your [facility]. If you have not heard from [place] or cannot find your results in [facility] in 2 weeks please contact our office at [phone].   If you are not yet signed up for [facility], please SIGN UP TODAY. We now offer online scheduling, same day appointments and extended hours. WHEN YOU DON'T FEEL YOUR BEST.Marland KitchenMarland KitchenWE ARE HERE TO HELP.   We recommend the following healthy lifestyle for LIFE: 1) Small portions.   Tip: eat off of a salad plate instead of a dinner plate.  Tip: if you need more or a snack choose fruits, veggies and/or a handful of nuts or seeds.  2) Eat a healthy clean diet.  * Tip: Avoid (less then 1 serving per week): processed foods, sweets, sweetened drinks, white starches (rice, flour, bread, potatoes, pasta, etc), red meat, fast foods, butter  *Tip: CHOOSE instead   * 5-9 servings per day of fresh or frozen fruits and vegetables (but not corn, potatoes, bananas, canned or dried fruit)   *nuts and seeds, beans   *olives and olive oil   *small portions of lean meats such as fish and white chicken    *small portions of whole grains  3)Get at least 150 minutes of sweaty aerobic exercise per week.  4)Reduce stress - consider counseling, meditation and relaxation to balance other aspects of your life.

## 2016-11-26 LAB — HEPATITIS C ANTIBODY: HCV Ab: NEGATIVE

## 2016-11-28 DIAGNOSIS — Z1231 Encounter for screening mammogram for malignant neoplasm of breast: Secondary | ICD-10-CM | POA: Diagnosis not present

## 2016-11-28 LAB — HM MAMMOGRAPHY

## 2016-11-29 LAB — CYTOLOGY - PAP
Diagnosis: NEGATIVE
HPV: NOT DETECTED

## 2016-12-08 ENCOUNTER — Encounter: Payer: Self-pay | Admitting: Family Medicine

## 2017-06-29 ENCOUNTER — Encounter: Payer: Self-pay | Admitting: Family Medicine

## 2017-08-09 DIAGNOSIS — Z6827 Body mass index (BMI) 27.0-27.9, adult: Secondary | ICD-10-CM | POA: Diagnosis not present

## 2017-08-09 DIAGNOSIS — E7849 Other hyperlipidemia: Secondary | ICD-10-CM | POA: Diagnosis not present

## 2017-08-09 DIAGNOSIS — Z136 Encounter for screening for cardiovascular disorders: Secondary | ICD-10-CM | POA: Diagnosis not present

## 2017-08-09 DIAGNOSIS — Z131 Encounter for screening for diabetes mellitus: Secondary | ICD-10-CM | POA: Diagnosis not present

## 2017-08-09 DIAGNOSIS — Z713 Dietary counseling and surveillance: Secondary | ICD-10-CM | POA: Diagnosis not present

## 2017-08-09 DIAGNOSIS — Z23 Encounter for immunization: Secondary | ICD-10-CM | POA: Diagnosis not present

## 2017-08-09 DIAGNOSIS — Z13220 Encounter for screening for lipoid disorders: Secondary | ICD-10-CM | POA: Diagnosis not present

## 2017-08-25 ENCOUNTER — Encounter: Payer: Self-pay | Admitting: Gastroenterology

## 2017-10-19 ENCOUNTER — Encounter: Payer: Self-pay | Admitting: Family Medicine

## 2017-12-01 ENCOUNTER — Encounter: Payer: Self-pay | Admitting: Gastroenterology

## 2017-12-08 ENCOUNTER — Other Ambulatory Visit: Payer: Self-pay | Admitting: Family Medicine

## 2017-12-08 ENCOUNTER — Encounter: Payer: Self-pay | Admitting: Family Medicine

## 2017-12-08 DIAGNOSIS — Z1211 Encounter for screening for malignant neoplasm of colon: Secondary | ICD-10-CM

## 2017-12-08 NOTE — Progress Notes (Signed)
I left a detailed message with the information below at the pts cell number. 

## 2017-12-08 NOTE — Progress Notes (Signed)
Please let her know I am glad she set up her colon cancer screening. Referral placed.

## 2017-12-25 DIAGNOSIS — Z1231 Encounter for screening mammogram for malignant neoplasm of breast: Secondary | ICD-10-CM | POA: Diagnosis not present

## 2018-01-22 ENCOUNTER — Other Ambulatory Visit: Payer: Self-pay

## 2018-01-22 ENCOUNTER — Ambulatory Visit (AMBULATORY_SURGERY_CENTER): Payer: Self-pay | Admitting: *Deleted

## 2018-01-22 ENCOUNTER — Encounter: Payer: Self-pay | Admitting: Gastroenterology

## 2018-01-22 VITALS — Ht 67.0 in | Wt 176.0 lb

## 2018-01-22 DIAGNOSIS — Z1211 Encounter for screening for malignant neoplasm of colon: Secondary | ICD-10-CM

## 2018-01-22 MED ORDER — NA SULFATE-K SULFATE-MG SULF 17.5-3.13-1.6 GM/177ML PO SOLN: ORAL | 0 refills | Status: DC

## 2018-01-22 NOTE — Progress Notes (Signed)
Patient denies any allergies to eggs or soy. Patient denies any problems with anesthesia/sedation. Patient denies any oxygen use at home. Patient denies taking any diet/weight loss medications or blood thinners. EMMI education declined by the pt. Suprep coupon given to pt during pv.

## 2018-01-30 NOTE — Progress Notes (Signed)
HPI:  Using dictation device. Unfortunately this device frequently misinterprets words/phrases.  Here for CPE:  -Concerns and/or follow up today:   Chronic medical problems summarized below were reviewed for changes.  She has a past medical history significant for osteopenia, hyperlipidemia and hyperglycemia. Reports doing well without any complaints. Exercising at the gym several days per week. Diet ok, but does eat processed and diet food. Due for labs, colon cancer screening (scheduled)  -Diet: variety of foods, balance and well rounded, larger portion sizes -Exercise: no regular exercise -Taking folic acid, vitamin D or calcium: no -Diabetes and Dyslipidemia Screening: fasting for labs -Vaccines: see vaccine section EPIC -pap history: Pap done 11/2016 and normal -FDLMP: see nursing notes -sexual activity: yes, female partner, no new partners -wants STI testing (Hep C if born [birth year]): no -FH breast, colon or ovarian ca: see FH Last mammogram: 11/2016 BI-RADS 1, reports done in 11/2017 as well - [facility] Last colon cancer screening: Last colonoscopy in 2008, reports is scheduled for next week Breast Ca Risk Assessment: see family history and pt history DEXA (>/= 65): 2013, osteopenia per her report - she does not want to take medications, agrees to recheck though  -Alcohol, Tobacco, drug use: see social history  Review of Systems - no fevers, unintentional weight loss, vision loss, hearing loss, chest pain, sob, hemoptysis, melena, hematochezia, hematuria, genital discharge, changing or concerning skin lesions, bleeding, bruising, loc, thoughts of self harm or SI  Past Medical History:  Diagnosis Date  . Abnormal EKG    had extensive negative evaluation with cardiologist   . COLONIC POLYPS, HYPERPLASTIC 09/05/2007   Qualifier: Diagnosis of  By: Arsenio Loader RN, Carissa    . HEMORRHOIDS 07/26/2007   Qualifier: Diagnosis of  By: Regis Bill MD, Standley Brooking   . Hyperlipidemia   .  LIVER HEMANGIOMA 10/31/2007   Qualifier: Diagnosis of  By: Arsenio Loader RN, Ramond Craver    . Osteopenia   . RECTAL BLEEDING 07/26/2007   Qualifier: Diagnosis of  By: Regis Bill MD, Standley Brooking     Past Surgical History:  Procedure Laterality Date  . CESAREAN SECTION    . COLONOSCOPY  2008   w/Dr.Brodie  . HEMORROIDECTOMY  2008  . TUBAL LIGATION      Family History  Problem Relation Age of Onset  . Heart disease Father   . Arthritis Sister   . Cancer Maternal Grandmother        liver cancer  . Colon cancer Maternal Grandmother 41  . Heart disease Paternal Grandfather   . Stomach cancer Maternal Grandfather   . Esophageal cancer Neg Hx     Social History   Socioeconomic History  . Marital status: Married    Spouse name: Not on file  . Number of children: Not on file  . Years of education: Not on file  . Highest education level: Not on file  Occupational History  . Not on file  Social Needs  . Financial resource strain: Not on file  . Food insecurity:    Worry: Not on file    Inability: Not on file  . Transportation needs:    Medical: Not on file    Non-medical: Not on file  Tobacco Use  . Smoking status: Former Smoker    Types: Cigarettes    Last attempt to quit: 10/10/1994    Years since quitting: 23.3  . Smokeless tobacco: Never Used  . Tobacco comment: remote smoker  Substance and Sexual Activity  . Alcohol use: Yes  Alcohol/week: 3.0 oz    Types: 5 Standard drinks or equivalent per week    Comment: 5 drinks per weekends per pt  . Drug use: Never  . Sexual activity: Not on file  Lifestyle  . Physical activity:    Days per week: Not on file    Minutes per session: Not on file  . Stress: Not on file  Relationships  . Social connections:    Talks on phone: Not on file    Gets together: Not on file    Attends religious service: Not on file    Active member of club or organization: Not on file    Attends meetings of clubs or organizations: Not on file    Relationship  status: Not on file  Other Topics Concern  . Not on file  Social History Narrative   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      Spiritual Beliefs: Christian      Lifestyle: jogs or walks 5 days per week; diet is fair - she is working on diet and trying to loose weight              Current Outpatient Medications:  Marland Kitchen  Melatonin 5 MG TABS, Take 1 tablet by mouth at bedtime., Disp: , Rfl:  .  Na Sulfate-K Sulfate-Mg Sulf 17.5-3.13-1.6 GM/177ML SOLN, Suprep (no substitutions)-TAKE AS DIRECTED., Disp: 354 mL, Rfl: 0  EXAM:  Vitals:   02/01/18 0726  BP: 100/76  Pulse: 72  Temp: 98 F (36.7 C)    GENERAL: vitals reviewed and listed below, alert, oriented, appears well hydrated and in no acute distress  HEENT: head atraumatic, PERRLA, normal appearance of eyes, ears, nose and mouth. moist mucus membranes.  NECK: supple, no masses or lymphadenopathy  LUNGS: clear to auscultation bilaterally, no rales, rhonchi or wheeze  CV: HRRR, no peripheral edema or cyanosis, normal pedal pulses  ABDOMEN: bowel sounds normal, soft, non tender to palpation, no masses, no rebound or guarding  GU/BREAST: declined  SKIN: no rash or abnormal lesions  MS: normal gait, moves all extremities normally  NEURO: normal gait, speech and thought processing grossly intact, muscle tone grossly intact throughout  PSYCH: normal affect, pleasant and cooperative  ASSESSMENT AND PLAN:  Discussed the following assessment and plan:  PREVENTIVE EXAM: -Discussed and advised all [place] preventive services health task force level A and B recommendations for age, sex and risks. -Advised at least 150 minutes of exercise per week and a healthy diet  *olives and olive oil *lean meats such as fish and white chicken  *whole grains -labs, studies and vaccines per orders this encounter - Hemoglobin A1c - Lipid panel  2. Screening for depression -neg  3.  Osteopenia, unspecified location - DG Bone Density; Future   Patient advised to return to clinic immediately if symptoms worsen or persist or new concerns.  Patient Instructions  BEFORE YOU LEAVE: -labs -follow up: yearly for physical  We have ordered labs or studies at this visit. It can take up to 1-2 weeks for results and processing. IF results require follow up or explanation, we will call you with instructions. Clinically stable results will be released to your [facility]. If you have not heard from [place] or cannot find your results in [facility] in 2 weeks please contact our office at [phone].  If you are not yet signed up for [facility], please consider signing up.   Preventive  Care 40-64 Years, Female Preventive care refers to lifestyle choices and visits with your health care provider that can promote health and wellness. What does preventive care include?  A yearly physical exam. This is also called an annual well check.  Dental exams once or twice a year.  Routine eye exams. Ask your health care provider how often you should have your eyes checked.  Personal lifestyle choices, including: ? Daily care of your teeth and gums. ? Regular physical activity. ? Eating a healthy diet. ? Avoiding tobacco and drug use. ? Limiting alcohol use. ? Practicing safe sex. ? Taking low-dose aspirin daily starting at age 90. ? Taking vitamin and mineral supplements as recommended by your health care provider. What happens during an annual well check? The services and screenings done by your health care provider during your annual well check will depend on your age, overall health, lifestyle risk factors, and family history of disease. Counseling Your health care provider may ask you questions about your:  Alcohol use.  Tobacco use.  Drug use.  Emotional well-being.  Home and relationship well-being.  Sexual activity.  Eating habits.  Work and work Statistician.  Method of birth  control.  Menstrual cycle.  Pregnancy history.  Screening You may have the following tests or measurements:  Height, weight, and BMI.  Blood pressure.  Lipid and cholesterol levels. These may be checked every 5 years, or more frequently if you are over 54 years old.  Skin check.  Lung cancer screening. You may have this screening every year starting at age 44 if you have a 30-pack-year history of smoking and currently smoke or have quit within the past 15 years.  Fecal occult blood test (FOBT) of the stool. You may have this test every year starting at age 54.  Flexible sigmoidoscopy or colonoscopy. You may have a sigmoidoscopy every 5 years or a colonoscopy every 10 years starting at age 25.  Hepatitis C blood test.  Hepatitis B blood test.  Sexually transmitted disease (STD) testing.  Diabetes screening. This is done by checking your blood sugar (glucose) after you have not eaten for a while (fasting). You may have this done every 1-3 years.  Mammogram. This may be done every 1-2 years. Talk to your health care provider about when you should start having regular mammograms. This may depend on whether you have a family history of breast cancer.  BRCA-related cancer screening. This may be done if you have a family history of breast, ovarian, tubal, or peritoneal cancers.  Pelvic exam and Pap test. This may be done every 3 years starting at age 33. Starting at age 36, this may be done every 5 years if you have a Pap test in combination with an HPV test.  Bone density scan. This is done to screen for osteoporosis. You may have this scan if you are at high risk for osteoporosis.  Discuss your test results, treatment options, and if necessary, the need for more tests with your health care provider. Vaccines Your health care provider may recommend certain vaccines, such as:  Influenza vaccine. This is recommended every year.  Tetanus, diphtheria, and acellular pertussis (Tdap,  Td) vaccine. You may need a Td booster every 10 years.  Varicella vaccine. You may need this if you have not been vaccinated.  Zoster vaccine. You may need this after age 79.  Measles, mumps, and rubella (MMR) vaccine. You may need at least one dose of MMR if you were born in  1957 or later. You may also need a second dose.  Pneumococcal 13-valent conjugate (PCV13) vaccine. You may need this if you have certain conditions and were not previously vaccinated.  Pneumococcal polysaccharide (PPSV23) vaccine. You may need one or two doses if you smoke cigarettes or if you have certain conditions.  Meningococcal vaccine. You may need this if you have certain conditions.  Hepatitis A vaccine. You may need this if you have certain conditions or if you travel or work in places where you may be exposed to hepatitis A.  Hepatitis B vaccine. You may need this if you have certain conditions or if you travel or work in places where you may be exposed to hepatitis B.  Haemophilus influenzae type b (Hib) vaccine. You may need this if you have certain conditions.  Talk to your health care provider about which screenings and vaccines you need and how often you need them. This information is not intended to replace advice given to you by your health care provider. Make sure you discuss any questions you have with your health care provider. Document Released: 10/23/2015 Document Revised: 06/15/2016 Document Reviewed: 07/28/2015 Elsevier Interactive Patient Education  2018 [employer].           No follow-ups on file.  Lucretia Kern, DO

## 2018-02-01 ENCOUNTER — Ambulatory Visit (INDEPENDENT_AMBULATORY_CARE_PROVIDER_SITE_OTHER): Payer: BLUE CROSS/BLUE SHIELD | Admitting: Family Medicine

## 2018-02-01 ENCOUNTER — Encounter: Payer: Self-pay | Admitting: Family Medicine

## 2018-02-01 VITALS — BP 100/76 | HR 72 | Temp 98.0°F | Ht 67.0 in | Wt 176.0 lb

## 2018-02-01 DIAGNOSIS — Z0000 Encounter for general adult medical examination without abnormal findings: Secondary | ICD-10-CM

## 2018-02-01 DIAGNOSIS — Z1331 Encounter for screening for depression: Secondary | ICD-10-CM | POA: Diagnosis not present

## 2018-02-01 DIAGNOSIS — M8580 Other specified disorders of bone density and structure, unspecified site: Secondary | ICD-10-CM

## 2018-02-01 LAB — LIPID PANEL
CHOL/HDL RATIO: 3
Cholesterol: 200 mg/dL (ref 0–200)
HDL: 60 mg/dL (ref >39.00)
LDL CALC: 123 mg/dL — AB (ref 0–99)
NONHDL: 139.96
Triglycerides: 83 mg/dL (ref 0.0–149.0)
VLDL: 16.6 mg/dL (ref 0.0–40.0)

## 2018-02-01 LAB — HEMOGLOBIN A1C: HEMOGLOBIN A1C: 5.7 % (ref 4.6–6.5)

## 2018-02-01 NOTE — Patient Instructions (Signed)
BEFORE YOU LEAVE: -labs -follow up: yearly for physical  We have ordered labs or studies at this visit. It can take up to 1-2 weeks for results and processing. IF results require follow up or explanation, we will call you with instructions. Clinically stable results will be released to your [facility]. If you have not heard from [place] or cannot find your results in [facility] in 2 weeks please contact our office at [phone].  If you are not yet signed up for [facility], please consider signing up.   Preventive Care 40-64 Years, Female Preventive care refers to lifestyle choices and visits with your health care provider that can promote health and wellness. What does preventive care include?  A yearly physical exam. This is also called an annual well check.  Dental exams once or twice a year.  Routine eye exams. Ask your health care provider how often you should have your eyes checked.  Personal lifestyle choices, including: ? Daily care of your teeth and gums. ? Regular physical activity. ? Eating a healthy diet. ? Avoiding tobacco and drug use. ? Limiting alcohol use. ? Practicing safe sex. ? Taking low-dose aspirin daily starting at age 59. ? Taking vitamin and mineral supplements as recommended by your health care provider. What happens during an annual well check? The services and screenings done by your health care provider during your annual well check will depend on your age, overall health, lifestyle risk factors, and family history of disease. Counseling Your health care provider may ask you questions about your:  Alcohol use.  Tobacco use.  Drug use.  Emotional well-being.  Home and relationship well-being.  Sexual activity.  Eating habits.  Work and work Statistician.  Method of birth control.  Menstrual cycle.  Pregnancy history.  Screening You may have the following tests or measurements:  Height, weight, and BMI.  Blood pressure.  Lipid and  cholesterol levels. These may be checked every 5 years, or more frequently if you are over 89 years old.  Skin check.  Lung cancer screening. You may have this screening every year starting at age 37 if you have a 30-pack-year history of smoking and currently smoke or have quit within the past 15 years.  Fecal occult blood test (FOBT) of the stool. You may have this test every year starting at age 78.  Flexible sigmoidoscopy or colonoscopy. You may have a sigmoidoscopy every 5 years or a colonoscopy every 10 years starting at age 55.  Hepatitis C blood test.  Hepatitis B blood test.  Sexually transmitted disease (STD) testing.  Diabetes screening. This is done by checking your blood sugar (glucose) after you have not eaten for a while (fasting). You may have this done every 1-3 years.  Mammogram. This may be done every 1-2 years. Talk to your health care provider about when you should start having regular mammograms. This may depend on whether you have a family history of breast cancer.  BRCA-related cancer screening. This may be done if you have a family history of breast, ovarian, tubal, or peritoneal cancers.  Pelvic exam and Pap test. This may be done every 3 years starting at age 58. Starting at age 30, this may be done every 5 years if you have a Pap test in combination with an HPV test.  Bone density scan. This is done to screen for osteoporosis. You may have this scan if you are at high risk for osteoporosis.  Discuss your test results, treatment options, and if necessary,  the need for more tests with your health care provider. Vaccines Your health care provider may recommend certain vaccines, such as:  Influenza vaccine. This is recommended every year.  Tetanus, diphtheria, and acellular pertussis (Tdap, Td) vaccine. You may need a Td booster every 10 years.  Varicella vaccine. You may need this if you have not been vaccinated.  Zoster vaccine. You may need this after age  35.  Measles, mumps, and rubella (MMR) vaccine. You may need at least one dose of MMR if you were born in 1957 or later. You may also need a second dose.  Pneumococcal 13-valent conjugate (PCV13) vaccine. You may need this if you have certain conditions and were not previously vaccinated.  Pneumococcal polysaccharide (PPSV23) vaccine. You may need one or two doses if you smoke cigarettes or if you have certain conditions.  Meningococcal vaccine. You may need this if you have certain conditions.  Hepatitis A vaccine. You may need this if you have certain conditions or if you travel or work in places where you may be exposed to hepatitis A.  Hepatitis B vaccine. You may need this if you have certain conditions or if you travel or work in places where you may be exposed to hepatitis B.  Haemophilus influenzae type b (Hib) vaccine. You may need this if you have certain conditions.  Talk to your health care provider about which screenings and vaccines you need and how often you need them. This information is not intended to replace advice given to you by your health care provider. Make sure you discuss any questions you have with your health care provider. Document Released: 10/23/2015 Document Revised: 06/15/2016 Document Reviewed: 07/28/2015 Elsevier Interactive Patient Education  [employer].

## 2018-02-05 ENCOUNTER — Encounter: Payer: Self-pay | Admitting: Gastroenterology

## 2018-02-05 ENCOUNTER — Other Ambulatory Visit: Payer: Self-pay

## 2018-02-05 ENCOUNTER — Ambulatory Visit (AMBULATORY_SURGERY_CENTER): Payer: BLUE CROSS/BLUE SHIELD | Admitting: Gastroenterology

## 2018-02-05 VITALS — BP 111/72 | HR 65 | Temp 98.0°F | Resp 10 | Ht 67.0 in | Wt 176.0 lb

## 2018-02-05 DIAGNOSIS — Z1211 Encounter for screening for malignant neoplasm of colon: Secondary | ICD-10-CM

## 2018-02-05 DIAGNOSIS — D123 Benign neoplasm of transverse colon: Secondary | ICD-10-CM

## 2018-02-05 DIAGNOSIS — K635 Polyp of colon: Secondary | ICD-10-CM | POA: Diagnosis not present

## 2018-02-05 DIAGNOSIS — Z86010 Personal history of colonic polyps: Secondary | ICD-10-CM | POA: Diagnosis not present

## 2018-02-05 MED ORDER — SODIUM CHLORIDE 0.9 % IV SOLN: 500.0000 mL | Freq: Once | INTRAVENOUS | Status: DC

## 2018-02-05 NOTE — Progress Notes (Signed)
Report given to PACU, vss 

## 2018-02-05 NOTE — Progress Notes (Signed)
Pt's states no medical or surgical changes since previsit or office visit. 

## 2018-02-05 NOTE — Patient Instructions (Signed)
Impression/Recommendations:  Polyp handout given to patient. Hemorrhoid handout given to patient.  Resume previous diet. Continue present medications.  Repeat colonoscopy in 5-10 years for surveillance.  Date to be determined after pathology results reviewed.  YOU HAD AN ENDOSCOPIC PROCEDURE TODAY AT [facility]:   Refer to the procedure report that was given to you for any specific questions about what was found during the examination.  If the procedure report does not answer your questions, please call your gastroenterologist to clarify.  If you requested that your care partner not be given the details of your procedure findings, then the procedure report has been included in a sealed envelope for you to review at your convenience later.  YOU SHOULD EXPECT: Some feelings of bloating in the abdomen. Passage of more gas than usual.  Walking can help get rid of the air that was put into your GI tract during the procedure and reduce the bloating. If you had a lower endoscopy (such as a colonoscopy or flexible sigmoidoscopy) you may notice spotting of blood in your stool or on the toilet paper. If you underwent a bowel prep for your procedure, you may not have a normal bowel movement for a few days.  Please Note:  You might notice some irritation and congestion in your nose or some drainage.  This is from the oxygen used during your procedure.  There is no need for concern and it should clear up in a day or so.  SYMPTOMS TO REPORT IMMEDIATELY:   Following lower endoscopy (colonoscopy or flexible sigmoidoscopy):  Excessive amounts of blood in the stool  Significant tenderness or worsening of abdominal pains  Swelling of the abdomen that is new, acute  Fever of 100F or higher For urgent or emergent issues, a gastroenterologist can be reached at any hour by calling [phone].   DIET:  We do recommend a small meal at first, but then you may proceed to your regular diet.   Drink plenty of fluids but you should avoid alcoholic beverages for 24 hours.  ACTIVITY:  You should plan to take it easy for the rest of today and you should NOT DRIVE or use heavy machinery until tomorrow (because of the sedation medicines used during the test).    FOLLOW UP: Our staff will call the number listed on your records the next business day following your procedure to check on you and address any questions or concerns that you may have regarding the information given to you following your procedure. If we do not reach you, we will leave a message.  However, if you are feeling well and you are not experiencing any problems, there is no need to return our call.  We will assume that you have returned to your regular daily activities without incident.  If any biopsies were taken you will be contacted by phone or by letter within the next 1-3 weeks.  Please call us at [phone] if you have not heard about the biopsies in 3 weeks.    SIGNATURES/CONFIDENTIALITY: You and/or your care partner have signed paperwork which will be entered into your electronic medical record.  These signatures attest to the fact that that the information above on your After Visit Summary has been reviewed and is understood.  Full responsibility of the confidentiality of this discharge information lies with you and/or your care-partner.

## 2018-02-05 NOTE — Progress Notes (Signed)
Called to room to assist during endoscopic procedure.  Patient ID and intended procedure confirmed with present staff. Received instructions for my participation in the procedure from the performing physician.  

## 2018-02-05 NOTE — Op Note (Signed)
[place] Patient Name: Jodi Douglas Procedure Date: 02/05/2018 10:52 AM MRN: [id number] Endoscopist: Mauri Pole , MD Age: 57 Referring MD:  Date of Birth: 12-22-1960 Gender: Female Account #: [id number] Procedure:                Colonoscopy Indications:              Screening for colorectal malignant neoplasm, Last                            colonoscopy: 2008 Medicines:                Monitored Anesthesia Care Procedure:                Pre-Anesthesia Assessment:                           - Prior to the procedure, a History and Physical                            was performed, and patient medications and                            allergies were reviewed. The patient's tolerance of                            previous anesthesia was also reviewed. The risks                            and benefits of the procedure and the sedation                            options and risks were discussed with the patient.                            All questions were answered, and informed consent                            was obtained. Prior Anticoagulants: The patient has                            taken no previous anticoagulant or antiplatelet                            agents. ASA Grade Assessment: I - A normal, healthy                            patient. After reviewing the risks and benefits,                            the patient was deemed in satisfactory condition to                            undergo the procedure.  After obtaining informed consent, the colonoscope                            was passed under direct vision. Throughout the                            procedure, the patient's blood pressure, pulse, and                            oxygen saturations were monitored continuously. The                            Colonoscope was introduced through the anus and                            advanced to the the terminal ileum, with                   identification of the appendiceal orifice and IC                            valve. The colonoscopy was performed without                            difficulty. The patient tolerated the procedure                            well. The quality of the bowel preparation was                            excellent. The ileocecal valve, appendiceal                            orifice, and rectum were photographed. Scope In: 11:15:00 AM Scope Out: 11:32:51 AM Scope Withdrawal Time: 0 hours 11 minutes 44 seconds  Total Procedure Duration: 0 hours 17 minutes 51 seconds  Findings:                 The perianal and digital rectal examinations were                            normal.                           A 1 mm polyp was found in the transverse colon. The                            polyp was sessile. The polyp was removed with a                            cold biopsy forceps. Resection and retrieval were                            complete.  Non-bleeding internal hemorrhoids were found during                            retroflexion. The hemorrhoids were small.                           The exam was otherwise without abnormality. Complications:            No immediate complications. Estimated Blood Loss:     Estimated blood loss was minimal. Impression:               - One 1 mm polyp in the transverse colon, removed                            with a cold biopsy forceps. Resected and retrieved.                           - Non-bleeding internal hemorrhoids.                           - The examination was otherwise normal. Recommendation:           - Patient has a contact number available for                            emergencies. The signs and symptoms of potential                            delayed complications were discussed with the                            patient. Return to normal activities tomorrow.                            Written discharge  instructions were provided to the                            patient.                           - Resume previous diet.                           - Continue present medications.                           - Await pathology results.                           - Repeat colonoscopy in 5-10 years for surveillance                            based on pathology results. Mauri Pole, MD 02/05/2018 11:37:49 AM This report has been signed electronically.

## 2018-02-06 ENCOUNTER — Telehealth: Payer: Self-pay | Admitting: *Deleted

## 2018-02-06 ENCOUNTER — Encounter: Payer: Self-pay | Admitting: Gastroenterology

## 2018-02-06 NOTE — Telephone Encounter (Signed)
No answer for second post procedure call back Left message for patient to call with questions or concerns. Sm 

## 2018-02-06 NOTE — Telephone Encounter (Signed)
No answer for post procedure call back. Will attempt to call back later this afternoon. Sm 

## 2018-02-09 ENCOUNTER — Encounter: Payer: Self-pay | Admitting: Gastroenterology

## 2018-03-06 ENCOUNTER — Other Ambulatory Visit: Payer: BLUE CROSS/BLUE SHIELD

## 2018-03-07 ENCOUNTER — Ambulatory Visit (INDEPENDENT_AMBULATORY_CARE_PROVIDER_SITE_OTHER)
Admission: RE | Admit: 2018-03-07 | Discharge: 2018-03-07 | Disposition: A | Discharge status: Home / Self Care | Payer: BLUE CROSS/BLUE SHIELD | Source: Ambulatory Visit | Attending: Family Medicine | Admitting: Family Medicine

## 2018-03-07 DIAGNOSIS — M8580 Other specified disorders of bone density and structure, unspecified site: Secondary | ICD-10-CM

## 2018-03-11 DIAGNOSIS — M8580 Other specified disorders of bone density and structure, unspecified site: Secondary | ICD-10-CM | POA: Diagnosis not present

## 2019-03-18 ENCOUNTER — Telehealth: Payer: Self-pay | Admitting: *Deleted

## 2019-03-18 NOTE — Telephone Encounter (Signed)
Copied from [place] [id number]. Topic: Appointment Scheduling - Transfer of Care >> Mar 18, 2019  2:49 PM Rainey Pines A wrote: Pt is requesting to transfer FROM: Dr.Kim at [facility] Pt is requesting to transfer TO: Female provider at [facility] Reason for requested transfer: Provider no longer in office.

## 2019-03-19 NOTE — Telephone Encounter (Signed)
ok 

## 2019-03-19 NOTE — Telephone Encounter (Signed)
Patient notified TOC has been approved

## 2019-03-25 NOTE — Telephone Encounter (Signed)
Patient voiced that she would like to transfer her care to Dr. Bryan Lemma at [facility].  Dr. Bryan Lemma - Please advise. Thanks.

## 2019-03-27 NOTE — Telephone Encounter (Signed)
Tried to call to schedule, had to leave a message

## 2019-03-27 NOTE — Telephone Encounter (Signed)
Ok with me 

## 2019-04-18 ENCOUNTER — Telehealth: Payer: Self-pay

## 2019-04-18 NOTE — Telephone Encounter (Signed)
Questions for Screening COVID-19  Symptom onset: n/a  Travel or Contacts:no  During this illness, did/does the patient experience any of the following symptoms? Fever >100.5F []   Yes [x]   No []   Unknown Subjective fever (felt feverish) []   Yes [x]   No []   Unknown Chills []   Yes [x]   No []   Unknown Muscle aches (myalgia) []   Yes [x]   No []   Unknown Runny nose (rhinorrhea) []   Yes [x]   No []   Unknown Sore throat []   Yes [x]   No []   Unknown Cough (new onset or worsening of chronic cough) []   Yes [x]   No []   Unknown Shortness of breath (dyspnea) []   Yes [x]   No []   Unknown Nausea or vomiting []   Yes [x]   No []   Unknown Headache []   Yes [x]   No []   Unknown Abdominal pain  []   Yes [x]   No []   Unknown Diarrhea (?3 loose/looser than normal stools/24hr period) []   Yes [x]   No []   Unknown Other, specify:  Patient risk factors: Smoker? []   Current []   Former []   Never If female, currently pregnant? []   Yes []   No  Patient Active Problem List   Diagnosis Date Noted  . Osteopenia 05/16/2013  . Nonspecific abnormal electrocardiogram (ECG) (EKG) 05/16/2013  . Hyperlipidemia 05/16/2013    Plan:  []   High risk for COVID-19 with red flags go to ED (with CP, SOB, weak/lightheaded, or fever > 101.5). Call ahead.  []   High risk for COVID-19 but stable. Inform provider and coordinate time for [facility] visit.   []   No red flags but URI signs or symptoms okay for [facility] visit.

## 2019-04-19 ENCOUNTER — Ambulatory Visit (INDEPENDENT_AMBULATORY_CARE_PROVIDER_SITE_OTHER): Payer: Commercial Managed Care - PPO | Admitting: Family Medicine

## 2019-04-19 ENCOUNTER — Encounter: Payer: Self-pay | Admitting: Family Medicine

## 2019-04-19 VITALS — BP 128/86 | HR 68 | Temp 97.6°F | Ht 67.0 in | Wt 177.4 lb

## 2019-04-19 DIAGNOSIS — Z0000 Encounter for general adult medical examination without abnormal findings: Secondary | ICD-10-CM | POA: Diagnosis not present

## 2019-04-19 DIAGNOSIS — M8580 Other specified disorders of bone density and structure, unspecified site: Secondary | ICD-10-CM | POA: Diagnosis not present

## 2019-04-19 DIAGNOSIS — E785 Hyperlipidemia, unspecified: Secondary | ICD-10-CM

## 2019-04-19 DIAGNOSIS — Z23 Encounter for immunization: Secondary | ICD-10-CM

## 2019-04-19 DIAGNOSIS — Z1239 Encounter for other screening for malignant neoplasm of breast: Secondary | ICD-10-CM

## 2019-04-19 DIAGNOSIS — R7301 Impaired fasting glucose: Secondary | ICD-10-CM | POA: Diagnosis not present

## 2019-04-19 NOTE — Progress Notes (Signed)
Jodi Douglas is a 58 y.o. female  Chief Complaint  Patient presents with  . Establish Care    est care/ CPE- Fasting/ mammogram last year.    HPI: Jodi Douglas is a 57 y.o. female here to establish care with our office and for her annual CPE, fasting labs.  Pts previously PCP was Colin Benton.   Last PAP: 11/2016 - normal PAP, HPV neg - due in 2023 Last mammo: 11/2016 - pt states she had mammo in 2019, although I do not see this in the chart Last Dexa: 03/2018 Last colonoscopy: 01/2018 with [facility] - due in 2029  Diet/Exercise: exercises regularly (45-68min walking per day), average diet  Med refills needed today? n/a   Past Medical History:  Diagnosis Date  . Abnormal EKG    had extensive negative evaluation with cardiologist   . COLONIC POLYPS, HYPERPLASTIC 09/05/2007   Qualifier: Diagnosis of  By: Arsenio Loader RN, Carissa    . HEMORRHOIDS 07/26/2007   Qualifier: Diagnosis of  By: Regis Bill MD, Standley Brooking   . Hyperlipidemia   . LIVER HEMANGIOMA 10/31/2007   Qualifier: Diagnosis of  By: Arsenio Loader RN, Ramond Craver    . Osteopenia   . RECTAL BLEEDING 07/26/2007   Qualifier: Diagnosis of  By: Regis Bill MD, Standley Brooking     Past Surgical History:  Procedure Laterality Date  . CESAREAN SECTION    . COLONOSCOPY  2008   w/Dr.Brodie  . HEMORROIDECTOMY  2008  . TUBAL LIGATION      Social History   Socioeconomic History  . Marital status: Married    Spouse name: Not on file  . Number of children: Not on file  . Years of education: Not on file  . Highest education level: Not on file  Occupational History  . Not on file  Social Needs  . Financial resource strain: Not on file  . Food insecurity    Worry: Not on file    Inability: Not on file  . Transportation needs    Medical: Not on file    Non-medical: Not on file  Tobacco Use  . Smoking status: Former Smoker    Types: Cigarettes    Quit date: 10/10/1994    Years since quitting: 24.5  . Smokeless tobacco: Never Used  . Tobacco comment:  remote smoker  Substance and Sexual Activity  . Alcohol use: Yes    Alcohol/week: 5.0 standard drinks    Types: 5 Standard drinks or equivalent per week    Comment: 5 drinks per weekends per pt  . Drug use: Never  . Sexual activity: Not on file  Lifestyle  . Physical activity    Days per week: Not on file    Minutes per session: Not on file  . Stress: Not on file  Relationships  . Social Herbalist on phone: Not on file    Gets together: Not on file    Attends religious service: Not on file    Active member of club or organization: Not on file    Attends meetings of clubs or organizations: Not on file    Relationship status: Not on file  . Intimate partner violence    Fear of current or ex partner: Not on file    Emotionally abused: Not on file    Physically abused: Not on file    Forced sexual activity: Not on file  Other Topics Concern  . Not on file  Social History Narrative  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      Spiritual Beliefs: Christian      Lifestyle: jogs or walks 5 days per week; diet is fair - she is working on diet and trying to loose weight             Family History  Problem Relation Age of Onset  . Heart disease Father   . Arthritis Sister   . Cancer Maternal Grandmother        liver cancer  . Colon cancer Maternal Grandmother 1  . Heart disease Paternal Grandfather   . Stomach cancer Maternal Grandfather   . Esophageal cancer Neg Hx   . Rectal cancer Neg Hx      Immunization History  Administered Date(s) Administered  . Influenza,inj,Quad PF,6+ Mos 06/20/2013  . Influenza-Unspecified 08/14/2014, 10/10/2016  . Tdap 06/20/2013    Outpatient Encounter Medications as of 04/19/2019  Medication Sig  . Melatonin 5 MG TABS Take 1 tablet by mouth at bedtime.   Facility-Administered Encounter Medications as of 04/19/2019  Medication  . 0.9 %  sodium chloride infusion      ROS: Gen: no fever, chills  Skin: no rash, itching ENT: no ear pain, ear drainage, nasal congestion, rhinorrhea, sinus pressure, sore throat Eyes: no blurry vision, double vision Resp: no cough, wheeze,SOB Breast: no breast tenderness, no nipple discharge, no breast masses CV: no CP, palpitations, LE edema,  GI: no heartburn, n/v/d/c, abd pain GU: no dysuria, urgency, frequency, hematuria; no vaginal itching, odor, discharge MSK: Rt knee pain - chronic, myalgias, back pain Neuro: no dizziness, headache, weakness, vertigo Psych: no depression, anxiety, insomnia   Allergies  Allergen Reactions  . Sulfonamide Derivatives Hives  . Sulfa Antibiotics Hives    BP 128/86   Pulse 68   Temp 97.6 F (36.4 C) (Oral)   Ht 5\' 7"  (1.702 m)   Wt 177 lb 6.4 oz (80.5 kg)   LMP 01/09/2012   SpO2 97%   BMI 27.78 kg/m   Physical Exam  Constitutional: She is oriented to person, place, and time. She appears well-developed and well-nourished. No distress.  HENT:  Head: Normocephalic and atraumatic.  Right Ear: Tympanic membrane and ear canal normal.  Left Ear: Tympanic membrane and ear canal normal.  Nose: Nose normal.  Mouth/Throat: Oropharynx is clear and moist and mucous membranes are normal.  Eyes: Pupils are equal, round, and reactive to light. Conjunctivae are normal.  Neck: Neck supple. No thyromegaly present.  Cardiovascular: Normal rate, regular rhythm, normal heart sounds and intact distal pulses.  No murmur heard. Pulmonary/Chest: Effort normal and breath sounds normal. No respiratory distress. She has no wheezes. She has no rhonchi. Right breast exhibits no mass, no nipple discharge, no skin change and no tenderness. Left breast exhibits no mass, no nipple discharge, no skin change and no tenderness.  Abdominal: Soft. Bowel sounds are normal. She exhibits no distension and no mass. There is no abdominal tenderness.  Musculoskeletal:        General: No edema.  Lymphadenopathy:     She has no cervical adenopathy.  Neurological: She is alert and oriented to person, place, and time. She exhibits normal muscle tone. Coordination normal.  Skin: Skin is warm and dry.  Psychiatric: She has a normal mood and affect. Her behavior is normal.     A/P:  1. Annual physical exam - cont with healthy diet and regular CV  exercise - UTD on PAP and colonoscopy, referral for mammo - dental and vision UTD - ALT - AST - Basic metabolic panel - Lipid panel - VITAMIN D 25 Hydroxy (Vit-D Deficiency, Fractures) - next CPE in 1 year  2. Hyperlipidemia, unspecified hyperlipidemia type - Lipid panel  3. Osteopenia, unspecified location - recommend Vit D 1000-2000IU daily and calcium 1200-1500mg  daily - VITAMIN D 25 Hydroxy (Vit-D Deficiency, Fractures) - dexa in 03/2019  4. Impaired fasting glucose - Hemoglobin A1c  5. Screening for breast cancer - MM DIGITAL SCREENING BILATERAL; Future  6. Need for shingles vaccine - Varicella-zoster vaccine IM (Shingrix) - RTO in 2 mo for #2

## 2019-04-19 NOTE — Patient Instructions (Signed)
Health Maintenance, Female Adopting a healthy lifestyle and getting preventive care are important in promoting health and wellness. Ask your health care provider about:  The right schedule for you to have regular tests and exams.  Things you can do on your own to prevent diseases and keep yourself healthy. What should I know about diet, weight, and exercise? Eat a healthy diet   Eat a diet that includes plenty of vegetables, fruits, low-fat dairy products, and lean protein.  Do not eat a lot of foods that are high in solid fats, added sugars, or sodium. Maintain a healthy weight Body mass index (BMI) is used to identify weight problems. It estimates body fat based on height and weight. Your health care provider can help determine your BMI and help you achieve or maintain a healthy weight. Get regular exercise Get regular exercise. This is one of the most important things you can do for your health. Most adults should:  Exercise for at least 150 minutes each week. The exercise should increase your heart rate and make you sweat (moderate-intensity exercise).  Do strengthening exercises at least twice a week. This is in addition to the moderate-intensity exercise.  Spend less time sitting. Even light physical activity can be beneficial. Watch cholesterol and blood lipids Have your blood tested for lipids and cholesterol at 58 years of age, then have this test every 5 years. Have your cholesterol levels checked more often if:  Your lipid or cholesterol levels are high.  You are older than 58 years of age.  You are at high risk for heart disease. What should I know about cancer screening? Depending on your health history and family history, you may need to have cancer screening at various ages. This may include screening for:  Breast cancer.  Cervical cancer.  Colorectal cancer.  Skin cancer.  Lung cancer. What should I know about heart disease, diabetes, and high blood  pressure? Blood pressure and heart disease  High blood pressure causes heart disease and increases the risk of stroke. This is more likely to develop in people who have high blood pressure readings, are of African descent, or are overweight.  Have your blood pressure checked: ? Every 3-5 years if you are 18-39 years of age. ? Every year if you are 40 years old or older. Diabetes Have regular diabetes screenings. This checks your fasting blood sugar level. Have the screening done:  Once every three years after age 40 if you are at a normal weight and have a low risk for diabetes.  More often and at a younger age if you are overweight or have a high risk for diabetes. What should I know about preventing infection? Hepatitis B If you have a higher risk for hepatitis B, you should be screened for this virus. Talk with your health care provider to find out if you are at risk for hepatitis B infection. Hepatitis C Testing is recommended for:  Everyone born from 1945 through 1965.  Anyone with known risk factors for hepatitis C. Sexually transmitted infections (STIs)  Get screened for STIs, including gonorrhea and chlamydia, if: ? You are sexually active and are younger than 58 years of age. ? You are older than 58 years of age and your health care provider tells you that you are at risk for this type of infection. ? Your sexual activity has changed since you were last screened, and you are at increased risk for chlamydia or gonorrhea. Ask your health care provider if   you are at risk.  Ask your health care provider about whether you are at high risk for HIV. Your health care provider may recommend a prescription medicine to help prevent HIV infection. If you choose to take medicine to prevent HIV, you should first get tested for HIV. You should then be tested every 3 months for as long as you are taking the medicine. Pregnancy  If you are about to stop having your period (premenopausal) and  you may become pregnant, seek counseling before you get pregnant.  Take 400 to 800 micrograms (mcg) of folic acid every day if you become pregnant.  Ask for birth control (contraception) if you want to prevent pregnancy. Osteoporosis and menopause Osteoporosis is a disease in which the bones lose minerals and strength with aging. This can result in bone fractures. If you are 65 years old or older, or if you are at risk for osteoporosis and fractures, ask your health care provider if you should:  Be screened for bone loss.  Take a calcium or vitamin D supplement to lower your risk of fractures.  Be given hormone replacement therapy (HRT) to treat symptoms of menopause. Follow these instructions at home: Lifestyle  Do not use any products that contain nicotine or tobacco, such as cigarettes, e-cigarettes, and chewing tobacco. If you need help quitting, ask your health care provider.  Do not use street drugs.  Do not share needles.  Ask your health care provider for help if you need support or information about quitting drugs. Alcohol use  Do not drink alcohol if: ? Your health care provider tells you not to drink. ? You are pregnant, may be pregnant, or are planning to become pregnant.  If you drink alcohol: ? Limit how much you use to 0-1 drink a day. ? Limit intake if you are breastfeeding.  Be aware of how much alcohol is in your drink. In the [place] one drink equals one 12 oz bottle of beer (355 mL), one 5 oz glass of wine (148 mL), or one 1 oz glass of hard liquor (44 mL). General instructions  Schedule regular health, dental, and eye exams.  Stay current with your vaccines.  Tell your health care provider if: ? You often feel depressed. ? You have ever been abused or do not feel safe at home. Summary  Adopting a healthy lifestyle and getting preventive care are important in promoting health and wellness.  Follow your health care provider's instructions about healthy  diet, exercising, and getting tested or screened for diseases.  Follow your health care provider's instructions on monitoring your cholesterol and blood pressure. This information is not intended to replace advice given to you by your health care provider. Make sure you discuss any questions you have with your health care provider. Document Released: 04/11/2011 Document Revised: 09/19/2018 Document Reviewed: 09/19/2018 Elsevier Patient Education  2020 [employer]  

## 2019-04-20 LAB — BASIC METABOLIC PANEL
BUN: 19 mg/dL (ref 7–25)
CO2: 27 mmol/L (ref 20–32)
Calcium: 9.6 mg/dL (ref 8.6–10.4)
Chloride: 104 mmol/L (ref 98–110)
Creat: 0.82 mg/dL (ref 0.50–1.05)
Glucose, Bld: 89 mg/dL (ref 65–99)
Potassium: 3.9 mmol/L (ref 3.5–5.3)
Sodium: 141 mmol/L (ref 135–146)

## 2019-04-20 LAB — LIPID PANEL
Cholesterol: 201 mg/dL — ABNORMAL HIGH (ref <200)
HDL: 58 mg/dL (ref >=50)
LDL Cholesterol (Calc): 123 mg/dL (calc) — ABNORMAL HIGH
Non-HDL Cholesterol (Calc): 143 mg/dL (calc) — ABNORMAL HIGH (ref <130)
Total CHOL/HDL Ratio: 3.5 (calc) (ref <5.0)
Triglycerides: 94 mg/dL (ref <150)

## 2019-04-20 LAB — ALT: ALT: 20 U/L (ref 6–29)

## 2019-04-20 LAB — HEMOGLOBIN A1C
Hgb A1c MFr Bld: 5.5 % of total Hgb (ref <5.7)
Mean Plasma Glucose: 111 (calc)
eAG (mmol/L): 6.2 (calc)

## 2019-04-20 LAB — VITAMIN D 25 HYDROXY (VIT D DEFICIENCY, FRACTURES): Vit D, 25-Hydroxy: 25 ng/mL — ABNORMAL LOW (ref 30–100)

## 2019-04-20 LAB — AST: AST: 23 U/L (ref 10–35)

## 2019-04-24 ENCOUNTER — Encounter: Payer: Self-pay | Admitting: Family Medicine

## 2019-05-01 ENCOUNTER — Other Ambulatory Visit: Payer: Self-pay

## 2019-05-01 ENCOUNTER — Ambulatory Visit (HOSPITAL_BASED_OUTPATIENT_CLINIC_OR_DEPARTMENT_OTHER)
Admission: RE | Admit: 2019-05-01 | Discharge: 2019-05-01 | Disposition: A | Discharge status: Home / Self Care | Payer: Commercial Managed Care - PPO | Source: Ambulatory Visit | Attending: Family Medicine | Admitting: Family Medicine

## 2019-05-01 DIAGNOSIS — Z1239 Encounter for other screening for malignant neoplasm of breast: Secondary | ICD-10-CM | POA: Diagnosis present

## 2019-05-01 DIAGNOSIS — Z1231 Encounter for screening mammogram for malignant neoplasm of breast: Secondary | ICD-10-CM | POA: Diagnosis not present

## 2020-04-15 ENCOUNTER — Other Ambulatory Visit (HOSPITAL_BASED_OUTPATIENT_CLINIC_OR_DEPARTMENT_OTHER): Payer: Self-pay | Admitting: Family Medicine

## 2020-04-15 DIAGNOSIS — Z1231 Encounter for screening mammogram for malignant neoplasm of breast: Secondary | ICD-10-CM

## 2020-05-12 ENCOUNTER — Ambulatory Visit (HOSPITAL_BASED_OUTPATIENT_CLINIC_OR_DEPARTMENT_OTHER)
Admission: RE | Admit: 2020-05-12 | Discharge: 2020-05-12 | Disposition: A | Discharge status: Home / Self Care | Payer: Commercial Managed Care - PPO | Source: Ambulatory Visit | Attending: Family Medicine | Admitting: Family Medicine

## 2020-05-12 ENCOUNTER — Other Ambulatory Visit: Payer: Self-pay

## 2020-05-12 ENCOUNTER — Encounter (HOSPITAL_BASED_OUTPATIENT_CLINIC_OR_DEPARTMENT_OTHER): Payer: Self-pay

## 2020-05-12 DIAGNOSIS — Z1231 Encounter for screening mammogram for malignant neoplasm of breast: Secondary | ICD-10-CM | POA: Diagnosis present

## 2020-05-18 NOTE — Patient Instructions (Signed)
Health Maintenance Due  Topic Date Due   COVID-19 Vaccine (1) Never done   PAP SMEAR-Modifier  11/26/2019   INFLUENZA VACCINE  05/10/2020    Depression screen [facility] 2/9 02/01/2018 06/20/2013  Decreased Interest 0 0  Down, Depressed, Hopeless 0 0  PHQ - 2 Score 0 0

## 2020-05-20 ENCOUNTER — Ambulatory Visit (INDEPENDENT_AMBULATORY_CARE_PROVIDER_SITE_OTHER): Payer: Commercial Managed Care - PPO | Admitting: Family Medicine

## 2020-05-20 ENCOUNTER — Other Ambulatory Visit (HOSPITAL_COMMUNITY)
Admission: RE | Admit: 2020-05-20 | Discharge: 2020-05-20 | Disposition: A | Discharge status: Home / Self Care | Payer: Commercial Managed Care - PPO | Source: Ambulatory Visit | Attending: Family Medicine | Admitting: Family Medicine

## 2020-05-20 ENCOUNTER — Other Ambulatory Visit: Payer: Self-pay

## 2020-05-20 ENCOUNTER — Encounter: Payer: Self-pay | Admitting: Family Medicine

## 2020-05-20 VITALS — BP 136/88 | HR 77 | Temp 98.2°F | Ht 67.0 in | Wt 174.4 lb

## 2020-05-20 DIAGNOSIS — E785 Hyperlipidemia, unspecified: Secondary | ICD-10-CM | POA: Diagnosis not present

## 2020-05-20 DIAGNOSIS — D225 Melanocytic nevi of trunk: Secondary | ICD-10-CM

## 2020-05-20 DIAGNOSIS — R7301 Impaired fasting glucose: Secondary | ICD-10-CM

## 2020-05-20 DIAGNOSIS — Z124 Encounter for screening for malignant neoplasm of cervix: Secondary | ICD-10-CM

## 2020-05-20 DIAGNOSIS — M8580 Other specified disorders of bone density and structure, unspecified site: Secondary | ICD-10-CM

## 2020-05-20 DIAGNOSIS — Z0000 Encounter for general adult medical examination without abnormal findings: Secondary | ICD-10-CM | POA: Diagnosis not present

## 2020-05-20 LAB — BASIC METABOLIC PANEL
BUN: 14 mg/dL (ref 6–23)
CO2: 27 mEq/L (ref 19–32)
Calcium: 9.4 mg/dL (ref 8.4–10.5)
Chloride: 103 mEq/L (ref 96–112)
Creatinine, Ser: 0.78 mg/dL (ref 0.40–1.20)
GFR: 75.67 mL/min (ref >60.00)
Glucose, Bld: 99 mg/dL (ref 70–99)
Potassium: 4.5 mEq/L (ref 3.5–5.1)
Sodium: 140 mEq/L (ref 135–145)

## 2020-05-20 LAB — LIPID PANEL
Cholesterol: 191 mg/dL (ref 0–200)
HDL: 57.8 mg/dL (ref >39.00)
LDL Cholesterol: 119 mg/dL — ABNORMAL HIGH (ref 0–99)
NonHDL: 132.83
Total CHOL/HDL Ratio: 3
Triglycerides: 71 mg/dL (ref 0.0–149.0)
VLDL: 14.2 mg/dL (ref 0.0–40.0)

## 2020-05-20 LAB — AST: AST: 19 U/L (ref 0–37)

## 2020-05-20 LAB — HEMOGLOBIN A1C: Hgb A1c MFr Bld: 5.7 % (ref 4.6–6.5)

## 2020-05-20 LAB — VITAMIN D 25 HYDROXY (VIT D DEFICIENCY, FRACTURES): VITD: 25.23 ng/mL — ABNORMAL LOW (ref 30.00–100.00)

## 2020-05-20 LAB — ALT: ALT: 15 U/L (ref 0–35)

## 2020-05-20 NOTE — Progress Notes (Signed)
Jodi Douglas is a 59 y.o. female  Chief Complaint  Patient presents with  . Annual Exam    HPI: Jodi Douglas is a 59 y.o. female here for annual CPE, fasting labs, PAP.  No issues/concerns.  Last PAP: 2018 Last mammo: 05/13/20 Last Dexa: 03/2018 - osteopenia Last colonoscopy: 01/2018 - due in 10 years 2029  Dental: UTD Vision: due next year for eye exam Exercise: walking 10,000 steps/day; trying to reduce sugar  Pt had covid vaccine in 12/2019-01/2020  Past Medical History:  Diagnosis Date  . Abnormal EKG    had extensive negative evaluation with cardiologist   . COLONIC POLYPS, HYPERPLASTIC 09/05/2007   Qualifier: Diagnosis of  By: Arsenio Loader RN, Carissa    . HEMORRHOIDS 07/26/2007   Qualifier: Diagnosis of  By: Regis Bill MD, Standley Brooking   . Hyperlipidemia   . LIVER HEMANGIOMA 10/31/2007   Qualifier: Diagnosis of  By: Arsenio Loader RN, Ramond Craver    . Osteopenia   . RECTAL BLEEDING 07/26/2007   Qualifier: Diagnosis of  By: Regis Bill MD, Standley Brooking     Past Surgical History:  Procedure Laterality Date  . CESAREAN SECTION    . COLONOSCOPY  2008   w/Dr.Brodie  . HEMORROIDECTOMY  2008  . TUBAL LIGATION      Social History   Socioeconomic History  . Marital status: Married    Spouse name: Not on file  . Number of children: Not on file  . Years of education: Not on file  . Highest education level: Not on file  Occupational History  . Not on file  Tobacco Use  . Smoking status: Former Smoker    Types: Cigarettes    Quit date: 10/10/1994    Years since quitting: 25.6  . Smokeless tobacco: Never Used  . Tobacco comment: remote smoker  Vaping Use  . Vaping Use: Never used  Substance and Sexual Activity  . Alcohol use: Yes    Alcohol/week: 5.0 standard drinks    Types: 5 Standard drinks or equivalent per week    Comment: 5 drinks per weekends per pt  . Drug use: Never  . Sexual activity: Not on file  Other Topics Concern  . 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 Difficulty of Paying Living Expenses:   Food Insecurity:   . Worried About Charity fundraiser in the Last Year:   . Arboriculturist in the Last Year:   Transportation Needs:   . Film/video editor (Medical):   Marland Kitchen Lack of Transportation (Non-Medical):   Physical Activity:   . Days of Exercise per Week:   . Minutes of Exercise per Session:   Stress:   . Feeling of Stress :   Social Connections:   . Frequency of Communication with Friends and Family:   . Frequency of Social Gatherings with Friends and Family:   . Attends Religious Services:   . Active Member of Clubs or Organizations:   . Attends Archivist Meetings:   Marland Kitchen Marital Status:  Intimate Partner Violence:   . Fear of Current or Ex-Partner:   . Emotionally Abused:   Marland Kitchen Physically Abused:   . Sexually Abused:     Family History  Problem Relation Age of Onset  . Heart disease Father   . Arthritis Sister   . Cancer Maternal Grandmother        liver cancer  . Colon cancer Maternal Grandmother 37  . Heart disease Paternal Grandfather   . Stomach cancer Maternal Grandfather   . Esophageal cancer Neg Hx   . Rectal cancer Neg Hx      Immunization History  Administered Date(s) Administered  . Influenza,inj,Quad PF,6+ Mos 06/20/2013  . Influenza-Unspecified 08/14/2014, 10/10/2016  . Tdap 06/20/2013  . Zoster Recombinat (Shingrix) 04/19/2019    Outpatient Encounter Medications as of 05/20/2020  Medication Sig  . Melatonin 5 MG TABS Take 10 tablets by mouth at bedtime.    Facility-Administered Encounter Medications as of 05/20/2020  Medication  . 0.9 %  sodium chloride infusion     ROS: Gen: no fever,  chills  Skin: no rash, itching ENT: no ear pain, ear drainage, nasal congestion, rhinorrhea, sinus pressure, sore throat Eyes: no blurry vision, double vision Resp: no cough, wheeze,SOB Breast: no breast tenderness, no nipple discharge, no breast masses CV: no CP, palpitations, LE edema,  GI: no heartburn, n/v/d/c, abd pain GU: no dysuria, urgency, frequency, hematuria; no vaginal itching, odor, discharge MSK: no joint pain, myalgias, back pain Neuro: no dizziness, headache, weakness, vertigo Psych: no depression, anxiety, insomnia   Allergies  Allergen Reactions  . Sulfonamide Derivatives Hives  . Sulfa Antibiotics Hives    BP 136/88 (BP Location: Left Arm, Patient Position: Sitting, Cuff Size: Normal)   Pulse 77   Temp 98.2 F (36.8 C) (Oral)   Ht 5\' 7"  (1.702 m)   Wt 174 lb 6.4 oz (79.1 kg)   LMP 01/09/2012   SpO2 100%   BMI 27.31 kg/m    BP Readings from Last 3 Encounters:  05/20/20 136/88  04/19/19 128/86  02/05/18 111/72   Wt Readings from Last 3 Encounters:  05/20/20 174 lb 6.4 oz (79.1 kg)  04/19/19 177 lb 6.4 oz (80.5 kg)  02/05/18 176 lb (79.8 kg)   Physical Exam Constitutional:      General: She is not in acute distress.    Appearance: She is well-developed.  HENT:     Head: Normocephalic and atraumatic.     Right Ear: Tympanic membrane and ear canal normal.     Left Ear: Tympanic membrane and ear canal normal.     Nose: Nose normal.  Eyes:     Conjunctiva/sclera: Conjunctivae normal.     Pupils: Pupils are equal, round, and reactive to light.  Neck:     Thyroid: No thyromegaly.  Cardiovascular:     Rate and Rhythm: Normal rate and regular rhythm.     Heart sounds: Normal heart sounds. No murmur heard.   Pulmonary:     Effort: Pulmonary effort is normal. No respiratory distress.     Breath sounds: Normal breath sounds. No wheezing or rhonchi.  Abdominal:     General: Bowel sounds are normal. There is no distension.     Palpations: Abdomen is  soft. There is no mass.     Tenderness: There is no abdominal tenderness.  Genitourinary:    Labia:        Right: No rash, tenderness or lesion.        Left: No rash,  tenderness or lesion.      Vagina: No vaginal discharge, erythema, tenderness or bleeding.     Cervix: No cervical motion tenderness, discharge, friability, erythema or cervical bleeding.     Uterus: Normal.      Adnexa: Right adnexa normal and left adnexa normal.  Musculoskeletal:     Cervical back: Neck supple.  Lymphadenopathy:     Cervical: No cervical adenopathy.  Skin:    General: Skin is warm and dry.  Neurological:     Mental Status: She is alert and oriented to person, place, and time.     Motor: No abnormal muscle tone.     Coordination: Coordination normal.  Psychiatric:        Behavior: Behavior normal.      A/P:  1. Annual physical exam - discussed importance of regular CV exercise, healthy diet, adequate sleep - UTD on mammo and colonoscopy, due for dexa and PAP - dental exam UTD - immunizations UTD - AST - ALT - Basic metabolic panel - Lipid panel - VITAMIN D 25 Hydroxy (Vit-D Deficiency, Fractures) - next CPE in 1 year  2. Hyperlipidemia, unspecified hyperlipidemia type - Lipid panel  3. Impaired fasting glucose - Hemoglobin A1c  4. Osteopenia, unspecified location - VITAMIN D 25 Hydroxy (Vit-D Deficiency, Fractures) - DG Bone Density; Future  5. Screening for cervical cancer - Cytology - PAP( [place])  6. Atypical nevus of back - Ambulatory referral to Dermatology   This visit occurred during the SARS-CoV-2 public health emergency.  Safety protocols were in place, including screening questions prior to the visit, additional usage of staff PPE, and extensive cleaning of exam room while observing appropriate contact time as indicated for disinfecting solutions.

## 2020-05-22 ENCOUNTER — Encounter: Payer: Self-pay | Admitting: Family Medicine

## 2020-05-22 LAB — CYTOLOGY - PAP
Comment: NEGATIVE
Diagnosis: NEGATIVE
High risk HPV: NEGATIVE

## 2020-09-30 ENCOUNTER — Other Ambulatory Visit: Payer: Self-pay

## 2020-09-30 ENCOUNTER — Ambulatory Visit
Admission: RE | Admit: 2020-09-30 | Discharge: 2020-09-30 | Disposition: A | Discharge status: Home / Self Care | Payer: Commercial Managed Care - PPO | Source: Ambulatory Visit | Attending: Family Medicine | Admitting: Family Medicine

## 2020-09-30 DIAGNOSIS — M8580 Other specified disorders of bone density and structure, unspecified site: Secondary | ICD-10-CM

## 2020-10-01 ENCOUNTER — Encounter: Payer: Self-pay | Admitting: Family Medicine

## 2021-04-28 ENCOUNTER — Other Ambulatory Visit: Payer: Self-pay

## 2021-04-28 ENCOUNTER — Ambulatory Visit (INDEPENDENT_AMBULATORY_CARE_PROVIDER_SITE_OTHER): Payer: Commercial Managed Care - PPO | Admitting: Family Medicine

## 2021-04-28 ENCOUNTER — Encounter: Payer: Self-pay | Admitting: Family Medicine

## 2021-04-28 VITALS — BP 124/82 | HR 75 | Temp 97.4°F | Ht 66.0 in | Wt 176.8 lb

## 2021-04-28 DIAGNOSIS — Z1321 Encounter for screening for nutritional disorder: Secondary | ICD-10-CM | POA: Diagnosis not present

## 2021-04-28 DIAGNOSIS — Z0001 Encounter for general adult medical examination with abnormal findings: Secondary | ICD-10-CM | POA: Diagnosis not present

## 2021-04-28 DIAGNOSIS — E785 Hyperlipidemia, unspecified: Secondary | ICD-10-CM | POA: Diagnosis not present

## 2021-04-28 DIAGNOSIS — R7301 Impaired fasting glucose: Secondary | ICD-10-CM

## 2021-04-28 DIAGNOSIS — Z2821 Immunization not carried out because of patient refusal: Secondary | ICD-10-CM

## 2021-04-28 DIAGNOSIS — K644 Residual hemorrhoidal skin tags: Secondary | ICD-10-CM

## 2021-04-28 DIAGNOSIS — Z23 Encounter for immunization: Secondary | ICD-10-CM

## 2021-04-28 DIAGNOSIS — M8580 Other specified disorders of bone density and structure, unspecified site: Secondary | ICD-10-CM | POA: Diagnosis not present

## 2021-04-28 DIAGNOSIS — Z0000 Encounter for general adult medical examination without abnormal findings: Secondary | ICD-10-CM

## 2021-04-28 LAB — CBC
HCT: 39.8 % (ref 36.0–46.0)
Hemoglobin: 13.7 g/dL (ref 12.0–15.0)
MCHC: 34.5 g/dL (ref 30.0–36.0)
MCV: 91.1 fl (ref 78.0–100.0)
Platelets: 191 10*3/uL (ref 150.0–400.0)
RBC: 4.37 Mil/uL (ref 3.87–5.11)
RDW: 12.3 % (ref 11.5–15.5)
WBC: 6.3 10*3/uL (ref 4.0–10.5)

## 2021-04-28 LAB — BASIC METABOLIC PANEL
BUN: 14 mg/dL (ref 6–23)
CO2: 27 mEq/L (ref 19–32)
Calcium: 9.4 mg/dL (ref 8.4–10.5)
Chloride: 103 mEq/L (ref 96–112)
Creatinine, Ser: 0.66 mg/dL (ref 0.40–1.20)
GFR: 95.87 mL/min (ref >60.00)
Glucose, Bld: 92 mg/dL (ref 70–99)
Potassium: 3.8 mEq/L (ref 3.5–5.1)
Sodium: 140 mEq/L (ref 135–145)

## 2021-04-28 LAB — LIPID PANEL
Cholesterol: 214 mg/dL — ABNORMAL HIGH (ref 0–200)
HDL: 59.1 mg/dL (ref >39.00)
LDL Cholesterol: 131 mg/dL — ABNORMAL HIGH (ref 0–99)
NonHDL: 155.27
Total CHOL/HDL Ratio: 4
Triglycerides: 119 mg/dL (ref 0.0–149.0)
VLDL: 23.8 mg/dL (ref 0.0–40.0)

## 2021-04-28 LAB — VITAMIN D 25 HYDROXY (VIT D DEFICIENCY, FRACTURES): VITD: 27.69 ng/mL — ABNORMAL LOW (ref 30.00–100.00)

## 2021-04-28 LAB — HEMOGLOBIN A1C: Hgb A1c MFr Bld: 5.8 % (ref 4.6–6.5)

## 2021-04-28 LAB — AST: AST: 24 U/L (ref 0–37)

## 2021-04-28 LAB — ALT: ALT: 27 U/L (ref 0–35)

## 2021-04-28 MED ORDER — HYDROCORTISONE (PERIANAL) 2.5 % EX CREA: 1.0000 "application " | TOPICAL_CREAM | Freq: Three times a day (TID) | CUTANEOUS | 1 refills | Status: DC

## 2021-04-28 NOTE — Progress Notes (Signed)
Jodi Douglas is a 60 y.o. female  Chief Complaint  Patient presents with   Annual Exam    CPE. No pap or breast exam needed.  Pt c/o hemorrhoids, requesting medication for relief.     HPI: Jodi Douglas is a 60 y.o. female patient seen today for annual CPE, fasting labs.   Last PAP: 05/2020 - normal Last mammo: 05/2020 - got a call to schedule Last Dexa: 09/2020 - T-score = -2.2 Last colonoscopy: 01/2018 - Dr. Silverio Decamp - due in 01/2028  Dental: UTD Vision: has appt scheduled  Declines pneumonia vaccine today.  She is requesting Rx cream for hemorrhoids. She needs to resume stool softener and increase water intake and exercise.  Past Medical History:  Diagnosis Date   Abnormal EKG    had extensive negative evaluation with cardiologist    COLONIC POLYPS, HYPERPLASTIC 09/05/2007   Qualifier: Diagnosis of  By: Arsenio Loader RN, Carissa     HEMORRHOIDS 07/26/2007   Qualifier: Diagnosis of  By: Regis Bill MD, Standley Brooking    Hyperlipidemia    LIVER HEMANGIOMA 10/31/2007   Qualifier: Diagnosis of  By: Arsenio Loader RN, Carissa     Osteopenia    RECTAL BLEEDING 07/26/2007   Qualifier: Diagnosis of  By: Regis Bill MD, Standley Brooking     Past Surgical History:  Procedure Laterality Date   CESAREAN SECTION     COLONOSCOPY  2008   w/Dr.Brodie   HEMORROIDECTOMY  2008   TUBAL LIGATION      Social History   Socioeconomic History   Marital status: Married    Spouse name: Not on file   Number of children: Not on file   Years of education: Not on file   Highest education level: Not on file  Occupational History   Not on file  Tobacco Use   Smoking status: Former    Types: Cigarettes    Quit date: 10/10/1994    Years since quitting: 26.5   Smokeless tobacco: Never   Tobacco comments:    remote smoker  Vaping Use   Vaping Use: Never used  Substance and Sexual Activity   Alcohol use: Yes    Alcohol/week: 5.0 standard drinks    Types: 5 Standard drinks or equivalent per week    Comment: 5 drinks per  weekends per pt   Drug use: Never   Sexual activity: Not on file  Other Topics Concern   Not on file  Social History Narrative   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      Spiritual Beliefs: Christian      Lifestyle: jogs or walks 5 days per week; diet is fair - she is working on diet and trying to loose weight            Social Determinants of Radio broadcast assistant Strain: Not on file  Food Insecurity: Not on file  Transportation Needs: Not on file  Physical Activity: Not on file  Stress: Not on file  Social Connections: Not on file  Intimate Partner Violence: Not on file    Family History  Problem Relation Age of Onset   Heart disease Father    Arthritis Sister    Cancer Maternal Grandmother        liver cancer   Colon cancer Maternal Grandmother 82   Heart disease Paternal Grandfather    Stomach cancer Maternal Grandfather    Esophageal cancer Neg  Hx    Rectal cancer Neg Hx      Immunization History  Administered Date(s) Administered   Influenza,inj,Quad PF,6+ Mos 06/20/2013   Influenza-Unspecified 08/14/2014, 10/10/2016   Tdap 06/20/2013   Zoster Recombinat (Shingrix) 04/19/2019    Outpatient Encounter Medications as of 04/28/2021  Medication Sig   hydrocortisone (ANUSOL-HC) 2.5 % rectal cream Place 1 application rectally 3 (three) times daily.   Melatonin 5 MG TABS Take 10 tablets by mouth at bedtime.    Facility-Administered Encounter Medications as of 04/28/2021  Medication   0.9 %  sodium chloride infusion     ROS: Gen: no fever, chills  Skin: no rash, itching ENT: no ear pain, ear drainage, nasal congestion, rhinorrhea, sinus pressure, sore throat Eyes: no blurry vision, double vision Resp: no cough, wheeze,SOB Breast: no breast tenderness, no nipple discharge, no breast masses CV: no CP, palpitations, LE edema,  GI: no heartburn, n/v/d/c, abd pain GU: no dysuria, urgency, frequency,  hematuria MSK: no joint pain, myalgias, back pain Neuro: no dizziness, headache, weakness, vertigo Psych: no depression, anxiety, insomnia   Allergies  Allergen Reactions   Sulfonamide Derivatives Hives   Sulfa Antibiotics Hives    BP 124/82 (BP Location: Left Arm, Patient Position: Sitting, Cuff Size: Normal)   Pulse 75   Temp (!) 97.4 F (36.3 C) (Temporal)   Ht 5\' 6"  (1.676 m)   Wt 176 lb 12.8 oz (80.2 kg)   LMP 01/09/2012   SpO2 98%   BMI 28.54 kg/m  Wt Readings from Last 3 Encounters:  04/28/21 176 lb 12.8 oz (80.2 kg)  05/20/20 174 lb 6.4 oz (79.1 kg)  04/19/19 177 lb 6.4 oz (80.5 kg)   Temp Readings from Last 3 Encounters:  04/28/21 (!) 97.4 F (36.3 C) (Temporal)  05/20/20 98.2 F (36.8 C) (Oral)  04/19/19 97.6 F (36.4 C) (Oral)   BP Readings from Last 3 Encounters:  04/28/21 124/82  05/20/20 136/88  04/19/19 128/86   Pulse Readings from Last 3 Encounters:  04/28/21 75  05/20/20 77  04/19/19 68     Physical Exam Constitutional:      General: She is not in acute distress.    Appearance: She is well-developed.  HENT:     Head: Normocephalic and atraumatic.     Right Ear: Tympanic membrane and ear canal normal.     Left Ear: Tympanic membrane and ear canal normal.     Nose: Nose normal.     Mouth/Throat:     Mouth: Mucous membranes are moist.     Pharynx: Oropharynx is clear.  Eyes:     Conjunctiva/sclera: Conjunctivae normal.  Neck:     Thyroid: No thyromegaly.  Cardiovascular:     Rate and Rhythm: Normal rate and regular rhythm.     Heart sounds: Normal heart sounds. No murmur heard. Pulmonary:     Effort: Pulmonary effort is normal. No respiratory distress.     Breath sounds: Normal breath sounds. No wheezing or rhonchi.  Abdominal:     General: Bowel sounds are normal. There is no distension.     Palpations: Abdomen is soft. There is no mass.     Tenderness: There is no abdominal tenderness.  Musculoskeletal:     Cervical back: Neck  supple.     Right lower leg: No edema.     Left lower leg: No edema.  Lymphadenopathy:     Cervical: No cervical adenopathy.  Skin:    General: Skin is warm and dry.  Neurological:     Mental Status: She is alert and oriented to person, place, and time.     Motor: No abnormal muscle tone.     Coordination: Coordination normal.  Psychiatric:        Behavior: Behavior normal.     A/P:  1. Annual physical exam - discussed importance of regular CV exercise, healthy diet, adequate sleep - UTD on PAP, mammo, colonoscopy, dexa - UTD on dental and had vision appt - declines pneumonia vaccine today - CBC - Basic metabolic panel - AST - ALT - next CPE in 1 year  2. Osteopenia, unspecified location - dexa in 09/2020 - VITAMIN D 25 Hydroxy (Vit-D Deficiency, Fractures)  3. Hyperlipidemia, unspecified hyperlipidemia type - Lipid panel - AST - ALT  4. Impaired fasting glucose - Hemoglobin A1c  5. Encounter for vitamin deficiency screening - VITAMIN D 25 Hydroxy (Vit-D Deficiency, Fractures)  6. External hemorrhoid - increase water intake, regular exercise, restart BID stool  softener Rx: - hydrocortisone (ANUSOL-HC) 2.5 % rectal cream; Place 1 application rectally 3 (three) times daily.  Dispense: 28 g; Refill: 1  7. Pneumococcal vaccination declined by patient  8. Need for shingles vaccine - due for #2 - Varicella-zoster vaccine IM (Shingrix)   This visit occurred during the SARS-CoV-2 public health emergency.  Safety protocols were in place, including screening questions prior to the visit, additional usage of staff PPE, and extensive cleaning of exam room while observing appropriate contact time as indicated for disinfecting solutions.

## 2021-05-12 ENCOUNTER — Encounter: Payer: Self-pay | Admitting: Family Medicine

## 2021-05-12 DIAGNOSIS — R7301 Impaired fasting glucose: Secondary | ICD-10-CM | POA: Insufficient documentation

## 2021-05-12 DIAGNOSIS — E559 Vitamin D deficiency, unspecified: Secondary | ICD-10-CM | POA: Insufficient documentation

## 2021-06-07 ENCOUNTER — Other Ambulatory Visit: Payer: Self-pay

## 2021-06-07 ENCOUNTER — Other Ambulatory Visit (HOSPITAL_BASED_OUTPATIENT_CLINIC_OR_DEPARTMENT_OTHER): Payer: Self-pay | Admitting: Family Medicine

## 2021-06-07 ENCOUNTER — Ambulatory Visit (INDEPENDENT_AMBULATORY_CARE_PROVIDER_SITE_OTHER): Payer: Commercial Managed Care - PPO | Admitting: Family Medicine

## 2021-06-07 ENCOUNTER — Encounter: Payer: Self-pay | Admitting: Family Medicine

## 2021-06-07 VITALS — BP 126/78 | HR 77 | Temp 97.3°F | Ht 66.0 in | Wt 175.4 lb

## 2021-06-07 DIAGNOSIS — Z1231 Encounter for screening mammogram for malignant neoplasm of breast: Secondary | ICD-10-CM

## 2021-06-07 DIAGNOSIS — R590 Localized enlarged lymph nodes: Secondary | ICD-10-CM | POA: Insufficient documentation

## 2021-06-07 LAB — CBC WITH DIFFERENTIAL/PLATELET
Basophils Absolute: 0 10*3/uL (ref 0.0–0.1)
Basophils Relative: 0.5 % (ref 0.0–3.0)
Eosinophils Absolute: 0.1 10*3/uL (ref 0.0–0.7)
Eosinophils Relative: 1.1 % (ref 0.0–5.0)
HCT: 38 % (ref 36.0–46.0)
Hemoglobin: 12.7 g/dL (ref 12.0–15.0)
Lymphocytes Relative: 26.7 % (ref 12.0–46.0)
Lymphs Abs: 1.6 10*3/uL (ref 0.7–4.0)
MCHC: 33.5 g/dL (ref 30.0–36.0)
MCV: 91.5 fl (ref 78.0–100.0)
Monocytes Absolute: 0.4 10*3/uL (ref 0.1–1.0)
Monocytes Relative: 6.2 % (ref 3.0–12.0)
Neutro Abs: 4 10*3/uL (ref 1.4–7.7)
Neutrophils Relative %: 65.5 % (ref 43.0–77.0)
Platelets: 181 10*3/uL (ref 150.0–400.0)
RBC: 4.15 Mil/uL (ref 3.87–5.11)
RDW: 12.4 % (ref 11.5–15.5)
WBC: 6.1 10*3/uL (ref 4.0–10.5)

## 2021-06-07 NOTE — Progress Notes (Signed)
Established Patient Office Visit  Subjective:  Patient ID: Jodi Douglas, female    DOB: 1961/06/21  Age: 60 y.o. MRN: [id number]  CC:  Chief Complaint  Patient presents with   Cyst    Concerns about knots that patient have notice at nap of neck sometimes tender to touch symptoms x 3 weeks.     HPI ARALI OREGEL presents for evaluation of a 3-week history of tender knots in the back of her neck.  She denies any fevers chills weight loss or rashes.  Denies cat exposures.  She has no lesions or rashes or paretic symptoms of her scalp.  She feels well.  She had been bitten by a wasp on the back of her right hand.  Symptoms from that resolved after taking Benadryl.  Denies any recent cold or flu symptoms.  Past Medical History:  Diagnosis Date   Abnormal EKG    had extensive negative evaluation with cardiologist    COLONIC POLYPS, HYPERPLASTIC 09/05/2007   Qualifier: Diagnosis of  By: Arsenio Loader RN, Carissa     HEMORRHOIDS 07/26/2007   Qualifier: Diagnosis of  By: Regis Bill MD, Standley Brooking    Hyperlipidemia    LIVER HEMANGIOMA 10/31/2007   Qualifier: Diagnosis of  By: Arsenio Loader RN, Carissa     Osteopenia    RECTAL BLEEDING 07/26/2007   Qualifier: Diagnosis of  By: Regis Bill MD, Standley Brooking     Past Surgical History:  Procedure Laterality Date   CESAREAN SECTION     COLONOSCOPY  2008   w/Dr.Brodie   HEMORROIDECTOMY  2008   TUBAL LIGATION      Family History  Problem Relation Age of Onset   Heart disease Father    Arthritis Sister    Cancer Maternal Grandmother        liver cancer   Colon cancer Maternal Grandmother 82   Heart disease Paternal Grandfather    Stomach cancer Maternal Grandfather    Esophageal cancer Neg Hx    Rectal cancer Neg Hx     Social History   Socioeconomic History   Marital status: Married    Spouse name: Not on file   Number of children: Not on file   Years of education: Not on file   Highest education level: Not on file  Occupational History   Not on  file  Tobacco Use   Smoking status: Former    Types: Cigarettes    Quit date: 10/10/1994    Years since quitting: 26.6   Smokeless tobacco: Never   Tobacco comments:    remote smoker  Vaping Use   Vaping Use: Never used  Substance and Sexual Activity   Alcohol use: Yes    Alcohol/week: 5.0 standard drinks    Types: 5 Standard drinks or equivalent per week    Comment: 5 drinks per weekends per pt   Drug use: Never   Sexual activity: Not on file  Other Topics Concern   Not on file  Social History Narrative   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 28 g 1   Melatonin 5 MG TABS Take 10 tablets by mouth at bedtime.      Facility-Administered Medications Prior to Visit  Medication Dose Route Frequency Provider Last Rate Last Admin   0.9 %  sodium chloride infusion  500 mL Intravenous Once Nandigam, Venia Minks, MD        Allergies  Allergen Reactions   Sulfonamide Derivatives Hives   Sulfa Antibiotics Hives    ROS Review of Systems  Constitutional:  Negative for chills, diaphoresis, fatigue, fever and unexpected weight change.  HENT:  Negative for congestion, postnasal drip, sneezing and sore throat.   Eyes:  Negative for photophobia and visual disturbance.  Respiratory:  Negative for cough.   Cardiovascular: Negative.    Gastrointestinal:  Negative for abdominal pain.  Genitourinary: Negative.   Musculoskeletal:  Negative for arthralgias and myalgias.  Skin:  Negative for rash.  Neurological:  Negative for weakness and headaches.     Objective:    Physical Exam Vitals and nursing note reviewed.  Constitutional:      General: She is not in acute distress.    Appearance: Normal appearance. She is not ill-appearing, toxic-appearing or diaphoretic.  HENT:     Head: Normocephalic and atraumatic.     Right Ear: Tympanic membrane, ear canal and external ear normal.     Left Ear: Tympanic membrane, ear canal and external ear normal.     Mouth/Throat:     Mouth: Mucous membranes are moist.     Pharynx: Oropharynx is clear. No oropharyngeal exudate or posterior oropharyngeal erythema.  Eyes:     General:        Right eye: No discharge.        Left eye: No discharge.     Extraocular Movements: Extraocular movements intact.     Conjunctiva/sclera: Conjunctivae normal.     Pupils: Pupils are equal, round, and reactive to light.  Neck:     Vascular: No carotid bruit.   Cardiovascular:     Rate and Rhythm: Normal rate and regular rhythm.  Pulmonary:     Effort: Pulmonary effort is normal.     Breath sounds: Normal breath sounds.  Abdominal:     General: Bowel sounds are normal.  Musculoskeletal:     Cervical back: Normal range of motion and neck supple. No rigidity or tenderness.  Lymphadenopathy:     Cervical: Cervical adenopathy present.  Skin:    Findings: No rash.  Neurological:     Mental Status: She is alert and oriented to person, place, and time.    BP 126/78 (BP Location: Left Arm, Patient Position: Sitting, Cuff Size: Large)   Pulse 77   Temp (!) 97.3 F (36.3 C) (Temporal)   Ht '5\' 6"'$  (1.676 m)   Wt 175 lb 6.4 oz (79.6 kg)   LMP 01/09/2012   SpO2 98%   BMI 28.31 kg/m  Wt Readings from Last 3 Encounters:  06/07/21 175 lb 6.4 oz (79.6 kg)  04/28/21 176 lb 12.8 oz (80.2 kg)   05/20/20 174 lb 6.4 oz (79.1 kg)     Health Maintenance Due  Topic Date Due   Pneumococcal  Vaccine 92-75 Years old (1 - PCV) Never done   MAMMOGRAM  05/12/2021   INFLUENZA VACCINE  05/10/2021    There are no preventive care reminders to display for this patient.  No results found for: TSH Lab Results  Component Value Date   WBC 6.3 04/28/2021   HGB 13.7 04/28/2021   HCT 39.8 04/28/2021   MCV 91.1 04/28/2021   PLT 191.0 04/28/2021   Lab Results  Component Value Date   NA 140 04/28/2021   K 3.8 04/28/2021   CO2 27 04/28/2021   GLUCOSE 92 04/28/2021   BUN 14 04/28/2021   CREATININE 0.66 04/28/2021   AST 24 04/28/2021   ALT 27 04/28/2021   CALCIUM 9.4 04/28/2021   GFR 95.87 04/28/2021   Lab Results  Component Value Date   CHOL 214 (H) 04/28/2021   Lab Results  Component Value Date   HDL 59.10 04/28/2021   Lab Results  Component Value Date   LDLCALC 131 (H) 04/28/2021   Lab Results  Component Value Date   TRIG 119.0 04/28/2021   Lab Results  Component Value Date   CHOLHDL 4 04/28/2021   Lab Results  Component Value Date   HGBA1C 5.8 04/28/2021      Assessment & Plan:   Problem List Items Addressed This Visit       Immune and Lymphatic   Posterior cervical adenopathy - Primary   Relevant Orders   CBC w/Diff   US Soft Tissue Head/Neck (NON-THYROID)   Lactate dehydrogenase    No orders of the defined types were placed in this encounter.   Follow-up: Return in about 4 weeks (around 07/05/2021).    Libby Maw, MD

## 2021-06-08 LAB — LACTATE DEHYDROGENASE: LDH: 174 U/L (ref 120–250)

## 2021-06-10 ENCOUNTER — Other Ambulatory Visit: Payer: Self-pay

## 2021-06-10 ENCOUNTER — Ambulatory Visit (HOSPITAL_BASED_OUTPATIENT_CLINIC_OR_DEPARTMENT_OTHER)
Admission: RE | Admit: 2021-06-10 | Discharge: 2021-06-10 | Disposition: A | Discharge status: Home / Self Care | Payer: Commercial Managed Care - PPO | Source: Ambulatory Visit | Attending: Family Medicine | Admitting: Family Medicine

## 2021-06-10 DIAGNOSIS — R590 Localized enlarged lymph nodes: Secondary | ICD-10-CM | POA: Diagnosis not present

## 2021-06-18 ENCOUNTER — Other Ambulatory Visit (HOSPITAL_BASED_OUTPATIENT_CLINIC_OR_DEPARTMENT_OTHER): Payer: Commercial Managed Care - PPO

## 2021-06-22 ENCOUNTER — Ambulatory Visit: Payer: Commercial Managed Care - PPO | Admitting: Nurse Practitioner

## 2021-07-05 ENCOUNTER — Ambulatory Visit (HOSPITAL_BASED_OUTPATIENT_CLINIC_OR_DEPARTMENT_OTHER): Payer: Commercial Managed Care - PPO

## 2021-08-16 ENCOUNTER — Other Ambulatory Visit: Payer: Self-pay

## 2021-08-16 ENCOUNTER — Ambulatory Visit (HOSPITAL_BASED_OUTPATIENT_CLINIC_OR_DEPARTMENT_OTHER)
Admission: RE | Admit: 2021-08-16 | Discharge: 2021-08-16 | Disposition: A | Discharge status: Home / Self Care | Payer: Commercial Managed Care - PPO | Source: Ambulatory Visit | Attending: Family Medicine | Admitting: Family Medicine

## 2021-08-16 ENCOUNTER — Encounter (HOSPITAL_BASED_OUTPATIENT_CLINIC_OR_DEPARTMENT_OTHER): Payer: Self-pay

## 2021-08-16 DIAGNOSIS — Z1231 Encounter for screening mammogram for malignant neoplasm of breast: Secondary | ICD-10-CM | POA: Insufficient documentation

## 2022-02-01 DIAGNOSIS — B351 Tinea unguium: Secondary | ICD-10-CM | POA: Diagnosis not present

## 2022-02-01 DIAGNOSIS — D1801 Hemangioma of skin and subcutaneous tissue: Secondary | ICD-10-CM | POA: Diagnosis not present

## 2022-02-01 DIAGNOSIS — L304 Erythema intertrigo: Secondary | ICD-10-CM | POA: Diagnosis not present

## 2022-02-01 DIAGNOSIS — L299 Pruritus, unspecified: Secondary | ICD-10-CM | POA: Diagnosis not present

## 2022-06-21 ENCOUNTER — Other Ambulatory Visit: Payer: Self-pay

## 2022-06-21 DIAGNOSIS — K644 Residual hemorrhoidal skin tags: Secondary | ICD-10-CM

## 2022-06-21 MED ORDER — HYDROCORTISONE (PERIANAL) 2.5 % EX CREA: 1.0000 | TOPICAL_CREAM | Freq: Three times a day (TID) | CUTANEOUS | 0 refills | Status: DC

## 2022-06-21 NOTE — Telephone Encounter (Signed)
Pt has upcoming est care appt 07/04/22

## 2022-07-04 ENCOUNTER — Ambulatory Visit (INDEPENDENT_AMBULATORY_CARE_PROVIDER_SITE_OTHER): Payer: BC Managed Care – PPO | Admitting: Nurse Practitioner

## 2022-07-04 ENCOUNTER — Encounter: Payer: Self-pay | Admitting: Nurse Practitioner

## 2022-07-04 VITALS — BP 120/90 | HR 68 | Temp 96.8°F | Wt 164.4 lb

## 2022-07-04 DIAGNOSIS — R7301 Impaired fasting glucose: Secondary | ICD-10-CM

## 2022-07-04 DIAGNOSIS — M8580 Other specified disorders of bone density and structure, unspecified site: Secondary | ICD-10-CM

## 2022-07-04 DIAGNOSIS — Z23 Encounter for immunization: Secondary | ICD-10-CM | POA: Diagnosis not present

## 2022-07-04 DIAGNOSIS — E559 Vitamin D deficiency, unspecified: Secondary | ICD-10-CM

## 2022-07-04 DIAGNOSIS — E785 Hyperlipidemia, unspecified: Secondary | ICD-10-CM | POA: Diagnosis not present

## 2022-07-04 DIAGNOSIS — Z1231 Encounter for screening mammogram for malignant neoplasm of breast: Secondary | ICD-10-CM

## 2022-07-04 DIAGNOSIS — Z0000 Encounter for general adult medical examination without abnormal findings: Secondary | ICD-10-CM

## 2022-07-04 LAB — LIPID PANEL
Cholesterol: 168 mg/dL (ref 0–200)
HDL: 52.7 mg/dL (ref >39.00)
LDL Cholesterol: 101 mg/dL — ABNORMAL HIGH (ref 0–99)
NonHDL: 115.58
Total CHOL/HDL Ratio: 3
Triglycerides: 74 mg/dL (ref 0.0–149.0)
VLDL: 14.8 mg/dL (ref 0.0–40.0)

## 2022-07-04 LAB — COMPREHENSIVE METABOLIC PANEL
ALT: 13 U/L (ref 0–35)
AST: 19 U/L (ref 0–37)
Albumin: 4.1 g/dL (ref 3.5–5.2)
Alkaline Phosphatase: 54 U/L (ref 39–117)
BUN: 14 mg/dL (ref 6–23)
CO2: 29 mEq/L (ref 19–32)
Calcium: 9.5 mg/dL (ref 8.4–10.5)
Chloride: 104 mEq/L (ref 96–112)
Creatinine, Ser: 0.72 mg/dL (ref 0.40–1.20)
GFR: 90.63 mL/min (ref >60.00)
Glucose, Bld: 95 mg/dL (ref 70–99)
Potassium: 3.8 mEq/L (ref 3.5–5.1)
Sodium: 140 mEq/L (ref 135–145)
Total Bilirubin: 0.6 mg/dL (ref 0.2–1.2)
Total Protein: 7.3 g/dL (ref 6.0–8.3)

## 2022-07-04 LAB — CBC
HCT: 38.1 % (ref 36.0–46.0)
Hemoglobin: 13.3 g/dL (ref 12.0–15.0)
MCHC: 34.8 g/dL (ref 30.0–36.0)
MCV: 92.5 fl (ref 78.0–100.0)
Platelets: 175 10*3/uL (ref 150.0–400.0)
RBC: 4.12 Mil/uL (ref 3.87–5.11)
RDW: 12.6 % (ref 11.5–15.5)
WBC: 4.5 10*3/uL (ref 4.0–10.5)

## 2022-07-04 LAB — VITAMIN D 25 HYDROXY (VIT D DEFICIENCY, FRACTURES): VITD: 40.36 ng/mL (ref 30.00–100.00)

## 2022-07-04 NOTE — Assessment & Plan Note (Signed)
T score -2.2 on 09/30/20.  Continue exercise and vitamin D supplement.  We will repeat DEXA scan 09/2025

## 2022-07-04 NOTE — Assessment & Plan Note (Signed)
She has been exercising and watching her diet.  She is lost 22 pounds recently.  We will check lipid panel today.

## 2022-07-04 NOTE — Patient Instructions (Signed)
It was great to see you!  Keep up the great work!  We are checking your labs today and will let you know the results via mychart/phone.   Let's follow-up in 1 year, sooner if you have concerns.  If a referral was placed today, you will be contacted for an appointment. Please note that routine referrals can sometimes take up to 3-4 weeks to process. Please call our office if you haven't heard anything after this time frame.  Take care,  Vance Peper, NP

## 2022-07-04 NOTE — Progress Notes (Signed)
New Patient Visit  BP (!) 120/90 (BP Location: Left Arm, Cuff Size: Large)   Pulse 68   Temp (!) 96.8 F (36 C) (Temporal)   Wt 164 lb 6.4 oz (74.6 kg)   LMP 01/09/2012   SpO2 98%   BMI 26.53 kg/m    Subjective:    Patient ID: Jodi Douglas, female    DOB: 1961/06/28, 62 y.o.   MRN: [id number]  CC: Chief Complaint  Patient presents with   Transitions Of Care    TOC. Est care. Overall health assessment. Pt requesting routine labs w/ physical. Pt is fasting.     HPI: Jodi Douglas is a 61 y.o. female presents to transfer care to a new provider.  Introduced to Designer, jewellery role and practice setting.  All questions answered.  Discussed provider/patient relationship and expectations.  She has lost 22 pounds over the past 3 months. She states that her blood pressure has been slightly elevated the past few months. She walks her dog every day and goes to the gym at least once a week.   Depression and anxiety screen done:     07/04/2022    9:40 AM 06/07/2021   11:46 AM 04/28/2021   10:23 AM 04/28/2021    9:53 AM 02/01/2018    7:28 AM  Depression screen PHQ 2/9  Decreased Interest 0 0 0 0 0  Down, Depressed, Hopeless 0 0 1 1 0  PHQ - 2 Score 0 0 1 1 0  Altered sleeping 1  1    Tired, decreased energy 0  0    Change in appetite 0  0    Feeling bad or failure about yourself  0  0    Trouble concentrating 0  0    Moving slowly or fidgety/restless 0  0    Suicidal thoughts 0  0    PHQ-9 Score 1  2    Difficult doing work/chores   Not difficult at all        07/04/2022    9:40 AM 04/28/2021   10:23 AM  GAD 7 : Generalized Anxiety Score  Nervous, Anxious, on Edge 0 1  Control/stop worrying 0 0  Worry too much - different things 0   Trouble relaxing 0 0  Restless 0 0  Easily annoyed or irritable 0 0  Afraid - awful might happen 0 1  Total GAD 7 Score 0   Anxiety Difficulty  Not difficult at all    Past Medical History:  Diagnosis Date   Abnormal EKG    had  extensive negative evaluation with cardiologist    COLONIC POLYPS, HYPERPLASTIC 09/05/2007   Qualifier: Diagnosis of  By: Arsenio Loader RN, Ramond Craver     HEMORRHOIDS 07/26/2007   Qualifier: Diagnosis of  By: Regis Bill MD, Standley Brooking    Hyperlipidemia    LIVER HEMANGIOMA 10/31/2007   Qualifier: Diagnosis of  By: Arsenio Loader RN, Carissa     Osteopenia    RECTAL BLEEDING 07/26/2007   Qualifier: Diagnosis of  By: Regis Bill MD, Standley Brooking     Past Surgical History:  Procedure Laterality Date   CESAREAN SECTION     COLONOSCOPY  2008   w/Dr.Brodie   HEMORROIDECTOMY  2008   TUBAL LIGATION      Family History  Problem Relation Age of Onset   Heart disease Father    Arthritis Sister    Cancer Maternal Grandmother        liver cancer   Colon cancer  Maternal Grandmother 82   Stomach cancer Maternal Grandfather    Heart disease Paternal Grandfather    Esophageal cancer Neg Hx    Rectal cancer Neg Hx      Social History   Tobacco Use   Smoking status: Former    Types: Cigarettes    Quit date: 10/10/1994    Years since quitting: 27.7   Smokeless tobacco: Never   Tobacco comments:    remote smoker  Vaping Use   Vaping Use: Never used  Substance Use Topics   Alcohol use: Yes    Alcohol/week: 5.0 standard drinks of alcohol    Types: 5 Standard drinks or equivalent per week    Comment: 2 drinks per weekends   Drug use: Never    Current Outpatient Medications on File Prior to Visit  Medication Sig Dispense Refill   cholecalciferol (VITAMIN D3) 25 MCG (1000 UNIT) tablet Take 2,000 Units by mouth daily.     Melatonin 5 MG TABS Take 10 tablets by mouth at bedtime.      No current facility-administered medications on file prior to visit.     Review of Systems  Constitutional: Negative.   HENT: Negative.    Eyes: Negative.   Respiratory: Negative.    Cardiovascular: Negative.   Gastrointestinal: Negative.   Genitourinary: Negative.   Musculoskeletal: Negative.   Skin: Negative.   Neurological:  Negative.   Psychiatric/Behavioral: Negative.        Objective:    BP (!) 120/90 (BP Location: Left Arm, Cuff Size: Large)   Pulse 68   Temp (!) 96.8 F (36 C) (Temporal)   Wt 164 lb 6.4 oz (74.6 kg)   LMP 01/09/2012   SpO2 98%   BMI 26.53 kg/m   Wt Readings from Last 3 Encounters:  07/04/22 164 lb 6.4 oz (74.6 kg)  06/07/21 175 lb 6.4 oz (79.6 kg)  04/28/21 176 lb 12.8 oz (80.2 kg)    BP Readings from Last 3 Encounters:  07/04/22 (!) 120/90  06/07/21 126/78  04/28/21 124/82    Physical Exam Vitals and nursing note reviewed.  Constitutional:      General: She is not in acute distress.    Appearance: Normal appearance.  HENT:     Head: Normocephalic and atraumatic.     Right Ear: Tympanic membrane, ear canal and external ear normal.     Left Ear: Tympanic membrane, ear canal and external ear normal.  Eyes:     Conjunctiva/sclera: Conjunctivae normal.  Cardiovascular:     Rate and Rhythm: Normal rate and regular rhythm.     Pulses: Normal pulses.     Heart sounds: Normal heart sounds.  Pulmonary:     Effort: Pulmonary effort is normal.     Breath sounds: Normal breath sounds.  Abdominal:     Palpations: Abdomen is soft.     Tenderness: There is no abdominal tenderness.  Musculoskeletal:        General: Normal range of motion.     Cervical back: Normal range of motion and neck supple.     Right lower leg: No edema.     Left lower leg: No edema.  Lymphadenopathy:     Cervical: No cervical adenopathy.  Skin:    General: Skin is warm and dry.  Neurological:     General: No focal deficit present.     Mental Status: She is alert and oriented to person, place, and time.     Cranial Nerves: No cranial nerve deficit.  Coordination: Coordination normal.     Gait: Gait normal.  Psychiatric:        Mood and Affect: Mood normal.        Behavior: Behavior normal.        Thought Content: Thought content normal.        Judgment: Judgment normal.         Assessment & Plan:   Problem List Items Addressed This Visit       Endocrine   IFG (impaired fasting glucose)    Last A1c was 5.8%, will check A1c today.  She has lost 22 pounds in the past 3 months, congratulated her on this.  She is also been trying to limit sugars.      Relevant Orders   Comprehensive metabolic panel   Hemoglobin A1c     Musculoskeletal and Integument   Osteopenia    T score -2.2 on 09/30/20.  Continue exercise and vitamin D supplement.  We will repeat DEXA scan 09/2025        Other   Hyperlipidemia    She has been exercising and watching her diet.  She is lost 22 pounds recently.  We will check lipid panel today.      Relevant Orders   Lipid panel   Vitamin D deficiency    She is taking an over-the-counter vitamin D supplement, will check levels today and adjust regimen based on results.      Relevant Orders   VITAMIN D 25 Hydroxy (Vit-D Deficiency, Fractures)   Other Visit Diagnoses     Need for influenza vaccination    -  Primary   Relevant Orders   Flu Vaccine QUAD 6+ mos PF IM (Fluarix Quad PF) (Completed)   Routine general medical examination at a health care facility       Relevant Orders   CBC   Comprehensive metabolic panel   Encounter for screening mammogram for malignant neoplasm of breast       Relevant Orders   MM 3D SCREEN BREAST BILATERAL       LABORATORY TESTING:  - Pap smear: up to date  IMMUNIZATIONS:   - Tdap: Tetanus vaccination status reviewed: last tetanus booster within 10 years. - Influenza: Administered today - Pneumovax: Not applicable - Prevnar: Not applicable - HPV: Not applicable - Zostavax vaccine: Up to date  SCREENING: -Mammogram: Up to date  - Colonoscopy: Up to date  - Bone Density: Up to date  -Hearing Test: Not applicable  -Spirometry: Not applicable   PATIENT COUNSELING:   Advised to take 1 mg of folate supplement per day if capable of pregnancy.   Sexuality: Discussed sexually transmitted  diseases, partner selection, use of condoms, avoidance of unintended pregnancy  and contraceptive alternatives.   Advised to avoid cigarette smoking.  I discussed with the patient that most people either abstain from alcohol or drink within safe limits (<=14/week and <=4 drinks/occasion for males, <=7/weeks and <= 3 drinks/occasion for females) and that the risk for alcohol disorders and other health effects rises proportionally with the number of drinks per week and how often a drinker exceeds daily limits.  Discussed cessation/primary prevention of drug use and availability of treatment for abuse.   Diet: Encouraged to adjust caloric intake to maintain  or achieve ideal body weight, to reduce intake of dietary saturated fat and total fat, to limit sodium intake by avoiding high sodium foods and not adding table salt, and to maintain adequate dietary potassium and calcium preferably from  fresh fruits, vegetables, and low-fat dairy products.    stressed the importance of regular exercise  Injury prevention: Discussed safety belts, safety helmets, smoke detector, smoking near bedding or upholstery.   Dental health: Discussed importance of regular tooth brushing, flossing, and dental visits.    NEXT PREVENTATIVE PHYSICAL DUE IN 1 YEAR.  Follow up plan: Return in about 1 year (around 07/05/2023) for CPE.

## 2022-07-04 NOTE — Assessment & Plan Note (Signed)
She is taking an over-the-counter vitamin D supplement, will check levels today and adjust regimen based on results.

## 2022-07-04 NOTE — Assessment & Plan Note (Signed)
Last A1c was 5.8%, will check A1c today.  She has lost 22 pounds in the past 3 months, congratulated her on this.  She is also been trying to limit sugars.

## 2022-07-06 LAB — HEMOGLOBIN A1C
Est. average glucose Bld gHb Est-mCnc: 114 mg/dL
Hgb A1c MFr Bld: 5.6 % (ref 4.8–5.6)

## 2022-08-15 IMAGING — MG MM DIGITAL SCREENING BILAT W/ TOMO AND CAD
8 series · 8 of 24 positions shown · non-contrast
Comparison: Previous exam(s).

CLINICAL DATA: Screening.

EXAM:
DIGITAL SCREENING BILATERAL MAMMOGRAM WITH TOMOSYNTHESIS AND CAD
TECHNIQUE: Bilateral screening digital craniocaudal and mediolateral oblique
mammograms were obtained. Bilateral screening digital breast
tomosynthesis was performed. The images were evaluated with
computer-aided detection.

[L MLO synth-2D]
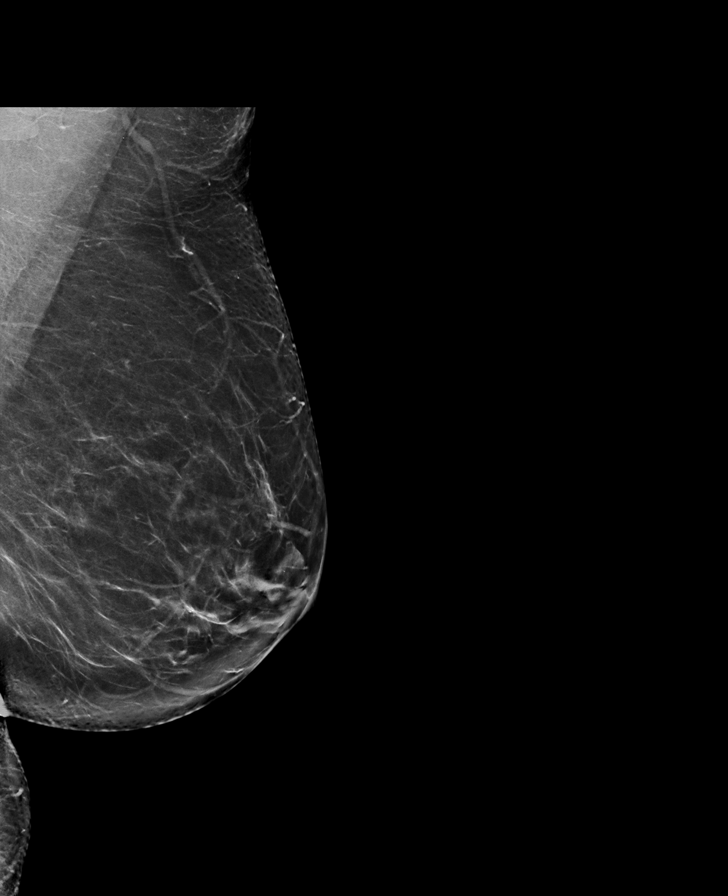

[L CC synth-2D]
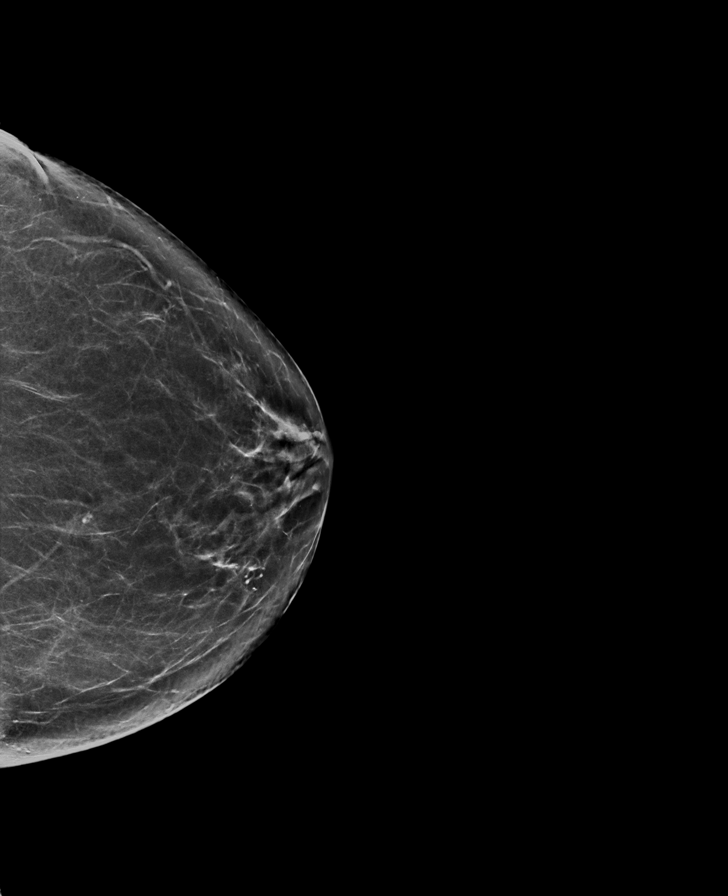

[R MLO synth-2D]
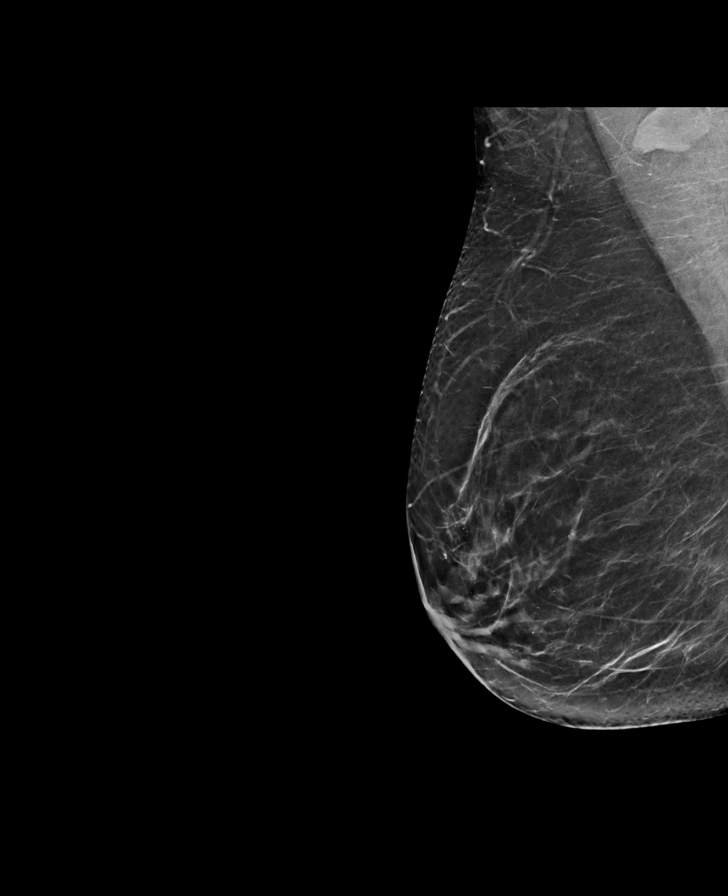

[R CC synth-2D]
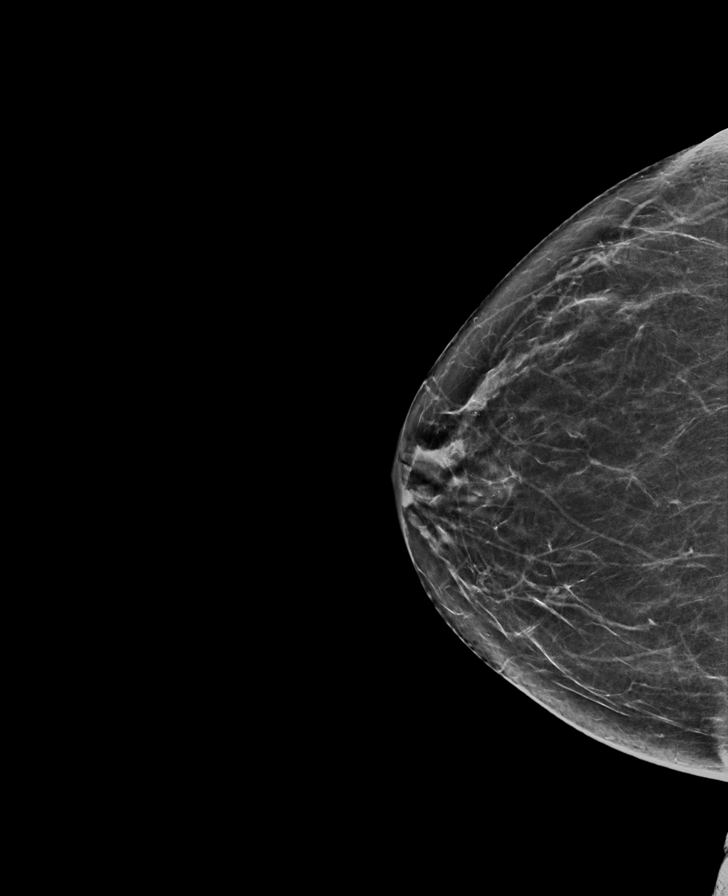

[R MLO tomo · tomo slice 36/71.0]
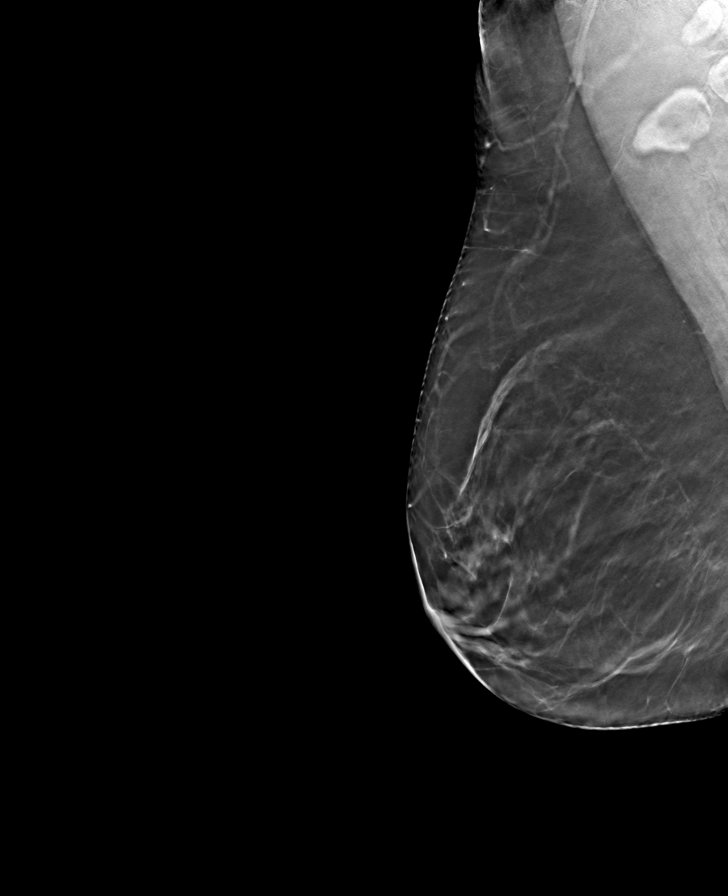

[L CC tomo · tomo slice 36/71.0]
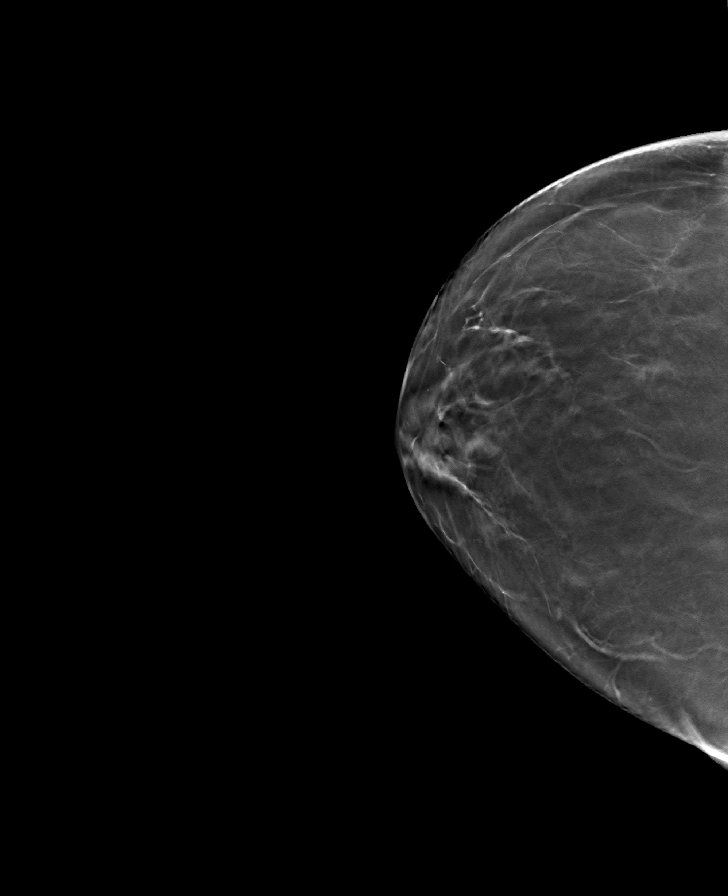

[R CC tomo · tomo slice 33/64.0]
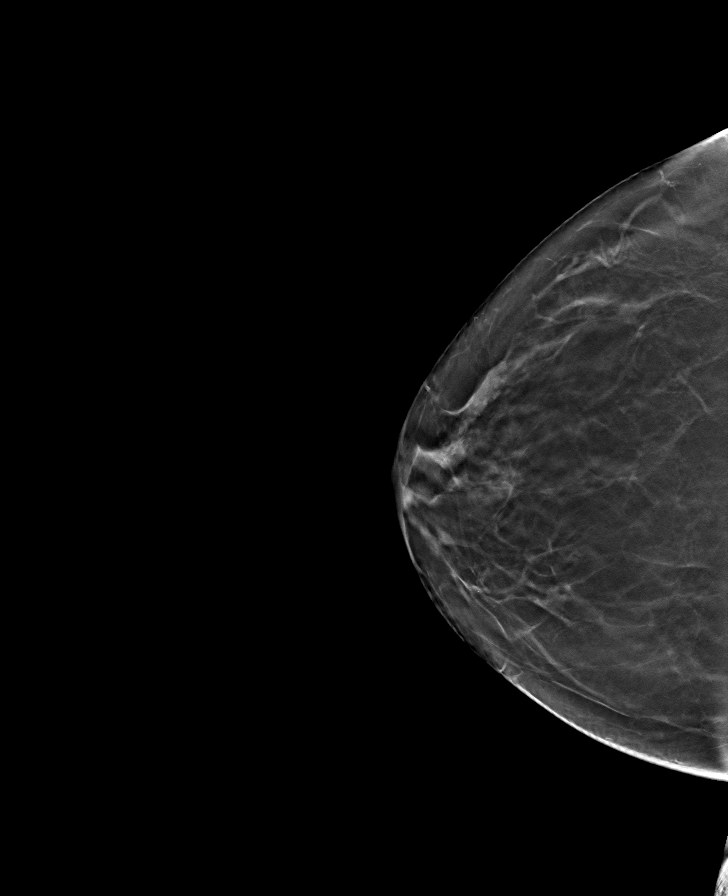

[L MLO tomo · tomo slice 39/78.0]
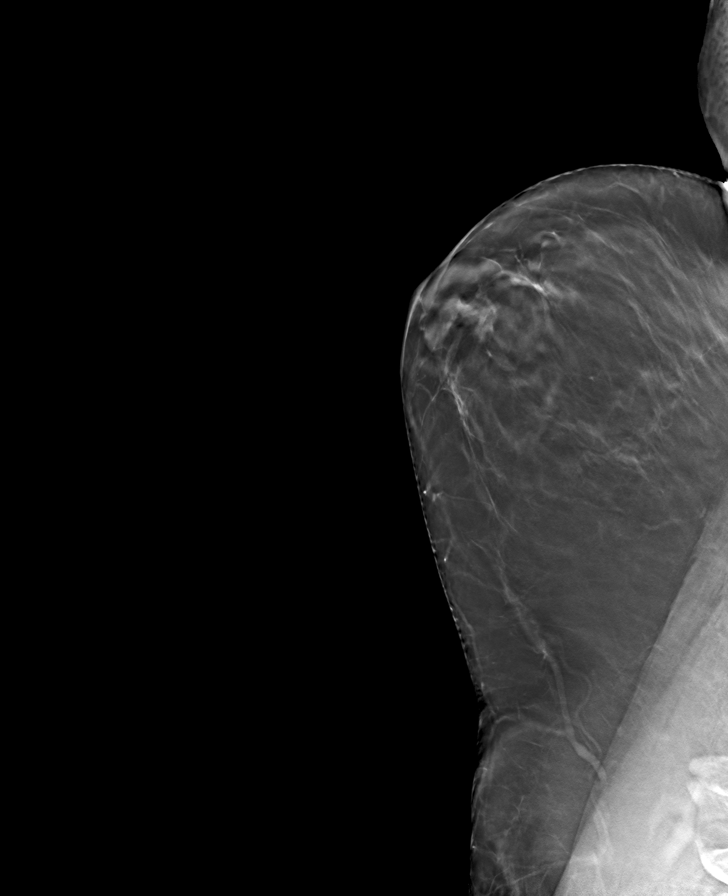

[8 of 24 positions shown; findings below may reference images not displayed]

ACR Breast Density Category b: There are scattered areas of
fibroglandular density.
FINDINGS: There are no findings suspicious for malignancy.
IMPRESSION: No mammographic evidence of malignancy. A result letter of this
screening mammogram will be mailed directly to the patient.

RECOMMENDATION:
Screening mammogram in one year. (Code:51-O-LD2)

BI-RADS CATEGORY  1: Negative.

## 2022-08-22 ENCOUNTER — Ambulatory Visit (HOSPITAL_BASED_OUTPATIENT_CLINIC_OR_DEPARTMENT_OTHER)
Admission: RE | Admit: 2022-08-22 | Discharge: 2022-08-22 | Disposition: A | Discharge status: Home / Self Care | Payer: BC Managed Care – PPO | Source: Ambulatory Visit | Attending: Nurse Practitioner | Admitting: Nurse Practitioner

## 2022-08-22 ENCOUNTER — Encounter (HOSPITAL_BASED_OUTPATIENT_CLINIC_OR_DEPARTMENT_OTHER): Payer: Self-pay

## 2022-08-22 DIAGNOSIS — Z1231 Encounter for screening mammogram for malignant neoplasm of breast: Secondary | ICD-10-CM | POA: Diagnosis not present

## 2023-06-05 DIAGNOSIS — H43813 Vitreous degeneration, bilateral: Secondary | ICD-10-CM | POA: Diagnosis not present

## 2023-07-24 ENCOUNTER — Other Ambulatory Visit (HOSPITAL_BASED_OUTPATIENT_CLINIC_OR_DEPARTMENT_OTHER): Payer: Self-pay | Admitting: Nurse Practitioner

## 2023-07-24 DIAGNOSIS — Z1231 Encounter for screening mammogram for malignant neoplasm of breast: Secondary | ICD-10-CM

## 2023-08-08 ENCOUNTER — Ambulatory Visit (INDEPENDENT_AMBULATORY_CARE_PROVIDER_SITE_OTHER): Payer: BC Managed Care – PPO | Admitting: Nurse Practitioner

## 2023-08-08 ENCOUNTER — Encounter: Payer: Self-pay | Admitting: Nurse Practitioner

## 2023-08-08 VITALS — BP 138/88 | HR 81 | Temp 97.6°F | Ht 66.0 in | Wt 175.8 lb

## 2023-08-08 DIAGNOSIS — Z23 Encounter for immunization: Secondary | ICD-10-CM | POA: Diagnosis not present

## 2023-08-08 DIAGNOSIS — R7303 Prediabetes: Secondary | ICD-10-CM | POA: Insufficient documentation

## 2023-08-08 DIAGNOSIS — Z0000 Encounter for general adult medical examination without abnormal findings: Secondary | ICD-10-CM | POA: Insufficient documentation

## 2023-08-08 DIAGNOSIS — E559 Vitamin D deficiency, unspecified: Secondary | ICD-10-CM | POA: Diagnosis not present

## 2023-08-08 DIAGNOSIS — G8929 Other chronic pain: Secondary | ICD-10-CM | POA: Insufficient documentation

## 2023-08-08 DIAGNOSIS — M21619 Bunion of unspecified foot: Secondary | ICD-10-CM | POA: Diagnosis not present

## 2023-08-08 DIAGNOSIS — M8580 Other specified disorders of bone density and structure, unspecified site: Secondary | ICD-10-CM | POA: Diagnosis not present

## 2023-08-08 DIAGNOSIS — E785 Hyperlipidemia, unspecified: Secondary | ICD-10-CM | POA: Diagnosis not present

## 2023-08-08 DIAGNOSIS — M545 Low back pain, unspecified: Secondary | ICD-10-CM

## 2023-08-08 LAB — COMPREHENSIVE METABOLIC PANEL
ALT: 19 U/L (ref 0–35)
AST: 20 U/L (ref 0–37)
Albumin: 4.2 g/dL (ref 3.5–5.2)
Alkaline Phosphatase: 58 U/L (ref 39–117)
BUN: 15 mg/dL (ref 6–23)
CO2: 30 meq/L (ref 19–32)
Calcium: 9.5 mg/dL (ref 8.4–10.5)
Chloride: 103 meq/L (ref 96–112)
Creatinine, Ser: 0.76 mg/dL (ref 0.40–1.20)
GFR: 84.28 mL/min (ref >60.00)
Glucose, Bld: 97 mg/dL (ref 70–99)
Potassium: 4 meq/L (ref 3.5–5.1)
Sodium: 138 meq/L (ref 135–145)
Total Bilirubin: 0.8 mg/dL (ref 0.2–1.2)
Total Protein: 6.9 g/dL (ref 6.0–8.3)

## 2023-08-08 LAB — CBC WITH DIFFERENTIAL/PLATELET
Basophils Absolute: 0 10*3/uL (ref 0.0–0.1)
Basophils Relative: 0.6 % (ref 0.0–3.0)
Eosinophils Absolute: 0.1 10*3/uL (ref 0.0–0.7)
Eosinophils Relative: 1.6 % (ref 0.0–5.0)
HCT: 41.4 % (ref 36.0–46.0)
Hemoglobin: 13.9 g/dL (ref 12.0–15.0)
Lymphocytes Relative: 38.3 % (ref 12.0–46.0)
Lymphs Abs: 1.9 10*3/uL (ref 0.7–4.0)
MCHC: 33.6 g/dL (ref 30.0–36.0)
MCV: 92.4 fL (ref 78.0–100.0)
Monocytes Absolute: 0.4 10*3/uL (ref 0.1–1.0)
Monocytes Relative: 7.4 % (ref 3.0–12.0)
Neutro Abs: 2.5 10*3/uL (ref 1.4–7.7)
Neutrophils Relative %: 52.1 % (ref 43.0–77.0)
Platelets: 203 10*3/uL (ref 150.0–400.0)
RBC: 4.48 Mil/uL (ref 3.87–5.11)
RDW: 12.8 % (ref 11.5–15.5)
WBC: 4.9 10*3/uL (ref 4.0–10.5)

## 2023-08-08 LAB — LIPID PANEL
Cholesterol: 202 mg/dL — ABNORMAL HIGH (ref 0–200)
HDL: 57.4 mg/dL (ref >39.00)
LDL Cholesterol: 124 mg/dL — ABNORMAL HIGH (ref 0–99)
NonHDL: 144.65
Total CHOL/HDL Ratio: 4
Triglycerides: 105 mg/dL (ref 0.0–149.0)
VLDL: 21 mg/dL (ref 0.0–40.0)

## 2023-08-08 LAB — VITAMIN D 25 HYDROXY (VIT D DEFICIENCY, FRACTURES): VITD: 28.06 ng/mL — ABNORMAL LOW (ref 30.00–100.00)

## 2023-08-08 LAB — HEMOGLOBIN A1C: Hgb A1c MFr Bld: 5.7 % (ref 4.6–6.5)

## 2023-08-08 MED ORDER — MELOXICAM 15 MG PO TABS: 15.0000 mg | ORAL_TABLET | Freq: Every day | ORAL | 0 refills | Status: DC | PRN

## 2023-08-08 MED ORDER — CYCLOBENZAPRINE HCL 5 MG PO TABS: 5.0000 mg | ORAL_TABLET | Freq: Three times a day (TID) | ORAL | 1 refills | Status: DC | PRN

## 2023-08-08 NOTE — Assessment & Plan Note (Signed)
She is taking an over-the-counter vitamin D supplement, will check levels today and adjust regimen based on results.

## 2023-08-08 NOTE — Assessment & Plan Note (Signed)
She experiences bilateral bunion pain, particularly in the second toe, and is reluctant to consider surgical intervention. We will continue conservative management.

## 2023-08-08 NOTE — Assessment & Plan Note (Signed)
Health maintenance reviewed and updated. Discussed nutrition, exercise. Check CMP, CBC today. Follow-up 1 year.   

## 2023-08-08 NOTE — Progress Notes (Signed)
BP 138/88 (BP Location: Right Arm)   Pulse 81   Temp 97.6 F (36.4 C)   Ht 5\' 6"  (1.676 m)   Wt 175 lb 12.8 oz (79.7 kg)   LMP 01/09/2012   SpO2 99%   BMI 28.37 kg/m    Subjective:    Patient ID: Jodi Douglas, female    DOB: 02-Nov-1960, 62 y.o.   MRN: [id number]  CC: Chief Complaint  Patient presents with   Annual Exam    With fasting lab work, concerns with lower back pain and bunions    HPI: Jodi Douglas is a 62 y.o. female presenting on 08/08/2023 for comprehensive medical examination. Current medical complaints include: back pain and bunion.   Discussed the use of AI scribe software for clinical note transcription with the patient, who gave verbal consent to proceed.  History of Present Illness   She reports that her back pain is triggered by physical activity, particularly hiking on weekends. She manages the pain with ibuprofen, ice, ointments, and a TENS unit. She also mentions foot pain due to bunions, which she manages with spacers and wide shoes. She expresses a fear of surgery and prefers to manage her symptoms conservatively. The patient also mentions weight fluctuations, possibly related to stress and lifestyle changes due to her son's health issues. She admits to consuming four to five glasses of wine per week.      She currently lives with: husband Menopausal Symptoms: no  Depression and Anxiety Screen done today and results listed below:     08/08/2023    9:01 AM 07/04/2022    9:40 AM 06/07/2021   11:46 AM 04/28/2021   10:23 AM 04/28/2021    9:53 AM  Depression screen PHQ 2/9  Decreased Interest 0 0 0 0 0  Down, Depressed, Hopeless 0 0 0 1 1  PHQ - 2 Score 0 0 0 1 1  Altered sleeping 1 1  1    Tired, decreased energy 1 0  0   Change in appetite 1 0  0   Feeling bad or failure about yourself  0 0  0   Trouble concentrating 0 0  0   Moving slowly or fidgety/restless 0 0  0   Suicidal thoughts 0 0  0   PHQ-9 Score 3 1  2    Difficult doing work/chores  Not difficult at all   Not difficult at all       08/08/2023    9:01 AM 07/04/2022    9:40 AM 04/28/2021   10:23 AM  GAD 7 : Generalized Anxiety Score  Nervous, Anxious, on Edge 0 0 1  Control/stop worrying 1 0 0  Worry too much - different things 1 0   Trouble relaxing 0 0 0  Restless 0 0 0  Easily annoyed or irritable 0 0 0  Afraid - awful might happen 1 0 1  Total GAD 7 Score 3 0   Anxiety Difficulty Not difficult at all  Not difficult at all    The patient does not have a history of falls. I did not complete a risk assessment for falls. A plan of care for falls was not documented.   Past Medical History:  Past Medical History:  Diagnosis Date   Abnormal EKG    had extensive negative evaluation with cardiologist    COLONIC POLYPS, HYPERPLASTIC 09/05/2007   Qualifier: Diagnosis of  By: Gwinda Passe RN, Carissa     HEMORRHOIDS 07/26/2007   Qualifier: Diagnosis  of  By: Fabian Sharp MD, Neta Mends    Hyperlipidemia    LIVER HEMANGIOMA 10/31/2007   Qualifier: Diagnosis of  By: Gwinda Passe RN, Carissa     Osteopenia    RECTAL BLEEDING 07/26/2007   Qualifier: Diagnosis of  By: Fabian Sharp MD, Neta Mends     Surgical History:  Past Surgical History:  Procedure Laterality Date   CESAREAN SECTION     COLONOSCOPY  2008   w/Dr.Brodie   HEMORROIDECTOMY  2008   TUBAL LIGATION      Medications:  Current Outpatient Medications on File Prior to Visit  Medication Sig   cholecalciferol (VITAMIN D3) 25 MCG (1000 UNIT) tablet Take 2,000 Units by mouth daily.   IBUPROFEN PO Take by mouth as needed.   Melatonin 5 MG TABS Take 10 tablets by mouth at bedtime.    No current facility-administered medications on file prior to visit.    Allergies:  Allergies  Allergen Reactions   Sulfonamide Derivatives Hives   Sulfa Antibiotics Hives    Social History:  Social History   Socioeconomic History   Marital status: Married    Spouse name: Not on file   Number of children: Not on file   Years of  education: Not on file   Highest education level: Bachelor's degree (e.g., BA, AB, BS)  Occupational History   Not on file  Tobacco Use   Smoking status: Former    Current packs/day: 0.00    Types: Cigarettes    Quit date: 10/10/1994    Years since quitting: 28.8   Smokeless tobacco: Never   Tobacco comments:    remote smoker  Vaping Use   Vaping status: Never Used  Substance and Sexual Activity   Alcohol use: Yes    Alcohol/week: 5.0 standard drinks of alcohol    Types: 5 Standard drinks or equivalent per week    Comment: 2 drinks per weekends   Drug use: Never   Sexual activity: Yes    Birth control/protection: Surgical, Post-menopausal  Other Topics Concern   Not on file  Social History Narrative   Not on file   Social Determinants of Health   Financial Resource Strain: Low Risk  (08/07/2023)   Overall Financial Resource Strain (CARDIA)    Difficulty of Paying Living Expenses: Not hard at all  Food Insecurity: No Food Insecurity (08/07/2023)   Hunger Vital Sign    Worried About Running Out of Food in the Last Year: Never true    Ran Out of Food in the Last Year: Never true  Transportation Needs: No Transportation Needs (08/07/2023)   PRAPARE - Administrator, Civil Service (Medical): No    Lack of Transportation (Non-Medical): No  Physical Activity: Sufficiently Active (08/07/2023)   Exercise Vital Sign    Days of Exercise per Week: 5 days    Minutes of Exercise per Session: 50 min  Stress: No Stress Concern Present (08/07/2023)   [employer] of Occupational Health - Occupational Stress Questionnaire    Feeling of Stress : Only a little  Social Connections: Moderately Integrated (08/07/2023)   Social Connection and Isolation Panel [NHANES]    Frequency of Communication with Friends and Family: More than three times a week    Frequency of Social Gatherings with Friends and Family: More than three times a week    Attends Religious Services: 1 to 4  times per year    Active Member of [employer] or Organizations: No    Attends Banker  Meetings: Not on file    Marital Status: Married  Intimate Partner Violence: Not on file   Social History   Tobacco Use  Smoking Status Former   Current packs/day: 0.00   Types: Cigarettes   Quit date: 10/10/1994   Years since quitting: 28.8  Smokeless Tobacco Never  Tobacco Comments   remote smoker   Social History   Substance and Sexual Activity  Alcohol Use Yes   Alcohol/week: 5.0 standard drinks of alcohol   Types: 5 Standard drinks or equivalent per week   Comment: 2 drinks per weekends    Family History:  Family History  Problem Relation Age of Onset   Heart disease Father    Arthritis Sister    Other Son        Leber Eye disease   Cancer Maternal Grandmother        liver cancer   Colon cancer Maternal Grandmother 82   Stomach cancer Maternal Grandfather    Heart disease Paternal Grandfather    Esophageal cancer Neg Hx    Rectal cancer Neg Hx     Past medical history, surgical history, medications, allergies, family history and social history reviewed with patient today and changes made to appropriate areas of the chart.   Review of Systems  Constitutional: Negative.   HENT: Negative.    Eyes: Negative.   Respiratory: Negative.    Cardiovascular: Negative.   Gastrointestinal: Negative.   Genitourinary: Negative.   Musculoskeletal:  Positive for back pain.       Bunion pain bilaterally  Skin: Negative.   Neurological: Negative.   Psychiatric/Behavioral: Negative.     All other ROS negative except what is listed above and in the HPI.      Objective:    BP 138/88 (BP Location: Right Arm)   Pulse 81   Temp 97.6 F (36.4 C)   Ht 5\' 6"  (1.676 m)   Wt 175 lb 12.8 oz (79.7 kg)   LMP 01/09/2012   SpO2 99%   BMI 28.37 kg/m   Wt Readings from Last 3 Encounters:  08/08/23 175 lb 12.8 oz (79.7 kg)  07/04/22 164 lb 6.4 oz (74.6 kg)  06/07/21 175 lb 6.4 oz  (79.6 kg)    Physical Exam Vitals and nursing note reviewed.  Constitutional:      General: She is not in acute distress.    Appearance: Normal appearance.  HENT:     Head: Normocephalic and atraumatic.     Right Ear: Tympanic membrane, ear canal and external ear normal.     Left Ear: Tympanic membrane, ear canal and external ear normal.  Eyes:     Conjunctiva/sclera: Conjunctivae normal.  Cardiovascular:     Rate and Rhythm: Normal rate and regular rhythm.     Pulses: Normal pulses.     Heart sounds: Normal heart sounds.  Pulmonary:     Effort: Pulmonary effort is normal.     Breath sounds: Normal breath sounds.  Abdominal:     Palpations: Abdomen is soft.     Tenderness: There is no abdominal tenderness.  Musculoskeletal:        General: Normal range of motion.     Cervical back: Normal range of motion and neck supple.     Right lower leg: No edema.     Left lower leg: No edema.  Lymphadenopathy:     Cervical: No cervical adenopathy.  Skin:    General: Skin is warm and dry.  Neurological:  General: No focal deficit present.     Mental Status: She is alert and oriented to person, place, and time.     Cranial Nerves: No cranial nerve deficit.     Coordination: Coordination normal.     Gait: Gait normal.  Psychiatric:        Mood and Affect: Mood normal.        Behavior: Behavior normal.        Thought Content: Thought content normal.        Judgment: Judgment normal.     Results for orders placed or performed in visit on 07/04/22  CBC  Result Value Ref Range   WBC 4.5 4.0 - 10.5 K/uL   RBC 4.12 3.87 - 5.11 Mil/uL   Platelets 175.0 150.0 - 400.0 K/uL   Hemoglobin 13.3 12.0 - 15.0 g/dL   HCT 91.4 78.2 - 95.6 %   MCV 92.5 78.0 - 100.0 fl   MCHC 34.8 30.0 - 36.0 g/dL   RDW 21.3 08.6 - 57.8 %  Comprehensive metabolic panel  Result Value Ref Range   Sodium 140 135 - 145 mEq/L   Potassium 3.8 3.5 - 5.1 mEq/L   Chloride 104 96 - 112 mEq/L   CO2 29 19 - 32  mEq/L   Glucose, Bld 95 70 - 99 mg/dL   BUN 14 6 - 23 mg/dL   Creatinine, Ser 4.69 0.40 - 1.20 mg/dL   Total Bilirubin 0.6 0.2 - 1.2 mg/dL   Alkaline Phosphatase 54 39 - 117 U/L   AST 19 0 - 37 U/L   ALT 13 0 - 35 U/L   Total Protein 7.3 6.0 - 8.3 g/dL   Albumin 4.1 3.5 - 5.2 g/dL   GFR 62.95 >28.41 mL/min   Calcium 9.5 8.4 - 10.5 mg/dL  Lipid panel  Result Value Ref Range   Cholesterol 168 0 - 200 mg/dL   Triglycerides 32.4 0.0 - 149.0 mg/dL   HDL 40.10 >27.25 mg/dL   VLDL 36.6 0.0 - 44.0 mg/dL   LDL Cholesterol 347 (H) 0 - 99 mg/dL   Total CHOL/HDL Ratio 3    NonHDL 115.58   VITAMIN D 25 Hydroxy (Vit-D Deficiency, Fractures)  Result Value Ref Range   VITD 40.36 30.00 - 100.00 ng/mL  Hemoglobin A1c  Result Value Ref Range   Hgb A1c MFr Bld 5.6 4.8 - 5.6 %   Est. average glucose Bld gHb Est-mCnc 114 mg/dL      Assessment & Plan:   Problem List Items Addressed This Visit       Musculoskeletal and Integument   Osteopenia    Last dexa 3 years ago showed T score -2.2. Will repeat DEXA scan. Continue regular exercise and vitamin D supplement.       Relevant Orders   DG Bone Density   Bunion of great toe    She experiences bilateral bunion pain, particularly in the second toe, and is reluctant to consider surgical intervention. We will continue conservative management.         Other   Hyperlipidemia    Chronic, stable. Check CMP, CBC, lipid panel today.       Relevant Orders   CBC with Differential/Platelet   Comprehensive metabolic panel   Lipid panel   Vitamin D deficiency    She is taking an over-the-counter vitamin D supplement, will check levels today and adjust regimen based on results.      Relevant Orders   VITAMIN D 25 Hydroxy (Vit-D Deficiency,  Fractures)   Prediabetes    Chronic, stable. Check A1c today.       Relevant Orders   Hemoglobin A1c   Routine general medical examination at a health care facility - Primary    Health maintenance  reviewed and updated. Discussed nutrition, exercise. Check CMP, CBC today. Follow-up 1 year.        Chronic midline low back pain without sciatica    Chronic lower back pain, exacerbated by physical activity without radiation to the legs, is currently managed with ibuprofen, ice, and topical ointments, and she has experienced some relief with muscle relaxants. We will order a lumbar spine x-ray to assess for structural abnormalities, prescribe Meloxicam 15mg  daily as needed for pain to be taken with food, and Flexeril 10mg  as needed for muscle spasms to be taken at bedtime. She will also receive back stretches to perform at home.      Relevant Medications   IBUPROFEN PO   meloxicam (MOBIC) 15 MG tablet   cyclobenzaprine (FLEXERIL) 5 MG tablet   Other Relevant Orders   DG Lumbar Spine Complete   Other Visit Diagnoses     Immunization due       Tdap and flu vaccine given today   Relevant Orders   Tdap vaccine greater than or equal to 7yo IM (Completed)   Flu vaccine trivalent PF, 6mos and older(Flulaval,Afluria,Fluarix,Fluzone) (Completed)        Follow up plan: Return in about 1 year (around 08/07/2024) for CPE.   LABORATORY TESTING:  - Pap smear: up to date  IMMUNIZATIONS:   - Tdap: Tetanus vaccination status reviewed: Td vaccination indicated and given today. - Influenza: Administered today - Pneumovax: Not applicable - Prevnar: Not applicable - HPV: Not applicable - Shingrix vaccine: Up to date  SCREENING: -Mammogram:  scheduled 08/24/23   - Colonoscopy: Up to date  - Bone Density: Ordered today   PATIENT COUNSELING:   Advised to take 1 mg of folate supplement per day if capable of pregnancy.   Sexuality: Discussed sexually transmitted diseases, partner selection, use of condoms, avoidance of unintended pregnancy  and contraceptive alternatives.   Advised to avoid cigarette smoking.  I discussed with the patient that most people either abstain from alcohol or  drink within safe limits (<=14/week and <=4 drinks/occasion for males, <=7/weeks and <= 3 drinks/occasion for females) and that the risk for alcohol disorders and other health effects rises proportionally with the number of drinks per week and how often a drinker exceeds daily limits.  Discussed cessation/primary prevention of drug use and availability of treatment for abuse.   Diet: Encouraged to adjust caloric intake to maintain  or achieve ideal body weight, to reduce intake of dietary saturated fat and total fat, to limit sodium intake by avoiding high sodium foods and not adding table salt, and to maintain adequate dietary potassium and calcium preferably from fresh fruits, vegetables, and low-fat dairy products.    stressed the importance of regular exercise  Injury prevention: Discussed safety belts, safety helmets, smoke detector, smoking near bedding or upholstery.   Dental health: Discussed importance of regular tooth brushing, flossing, and dental visits.    NEXT PREVENTATIVE PHYSICAL DUE IN 1 YEAR. Return in about 1 year (around 08/07/2024) for CPE.  Adryanna Friedt A Russie Gulledge

## 2023-08-08 NOTE — Patient Instructions (Signed)
It was great to see you!  We are checking your labs today and will let you know the results via mychart/phone.   Start meloxicam once a day as needed for back pain with food. Do not take ibuprofen or aleve with this   Start flexeril as needed for muscle spasms or tightness. This may make you sleepy  I have ordered an x-ray of your back and a dexa (bone scan).   Let's follow-up in 1 year, sooner if you have concerns.  If a referral was placed today, you will be contacted for an appointment. Please note that routine referrals can sometimes take up to 3-4 weeks to process. Please call our office if you haven't heard anything after this time frame.  Take care,  Rodman Pickle, NP

## 2023-08-08 NOTE — Assessment & Plan Note (Signed)
Chronic, stable.  Check CMP, CBC, lipid panel today. 

## 2023-08-08 NOTE — Assessment & Plan Note (Signed)
Chronic lower back pain, exacerbated by physical activity without radiation to the legs, is currently managed with ibuprofen, ice, and topical ointments, and she has experienced some relief with muscle relaxants. We will order a lumbar spine x-ray to assess for structural abnormalities, prescribe Meloxicam 15mg  daily as needed for pain to be taken with food, and Flexeril 10mg  as needed for muscle spasms to be taken at bedtime. She will also receive back stretches to perform at home.

## 2023-08-08 NOTE — Assessment & Plan Note (Signed)
Chronic, stable. Check A1c today.

## 2023-08-08 NOTE — Assessment & Plan Note (Signed)
Last dexa 3 years ago showed T score -2.2. Will repeat DEXA scan. Continue regular exercise and vitamin D supplement.

## 2023-08-11 ENCOUNTER — Ambulatory Visit
Admission: RE | Admit: 2023-08-11 | Discharge: 2023-08-11 | Disposition: A | Discharge status: Home / Self Care | Payer: BC Managed Care – PPO | Source: Ambulatory Visit | Attending: Nurse Practitioner | Admitting: Nurse Practitioner

## 2023-08-11 DIAGNOSIS — G8929 Other chronic pain: Secondary | ICD-10-CM | POA: Diagnosis not present

## 2023-08-11 DIAGNOSIS — M4316 Spondylolisthesis, lumbar region: Secondary | ICD-10-CM | POA: Diagnosis not present

## 2023-08-11 DIAGNOSIS — M545 Low back pain, unspecified: Secondary | ICD-10-CM | POA: Diagnosis not present

## 2023-08-11 DIAGNOSIS — M48061 Spinal stenosis, lumbar region without neurogenic claudication: Secondary | ICD-10-CM | POA: Diagnosis not present

## 2023-08-11 DIAGNOSIS — M47816 Spondylosis without myelopathy or radiculopathy, lumbar region: Secondary | ICD-10-CM | POA: Diagnosis not present

## 2023-08-22 ENCOUNTER — Other Ambulatory Visit: Payer: Self-pay | Admitting: Medical Genetics

## 2023-08-22 ENCOUNTER — Encounter: Payer: Self-pay | Admitting: Nurse Practitioner

## 2023-08-22 DIAGNOSIS — Z006 Encounter for examination for normal comparison and control in clinical research program: Secondary | ICD-10-CM

## 2023-08-22 NOTE — Telephone Encounter (Signed)
I called and spoke with Jodi Douglas in radiology reading room to have report read.

## 2023-08-24 ENCOUNTER — Encounter (HOSPITAL_BASED_OUTPATIENT_CLINIC_OR_DEPARTMENT_OTHER): Payer: Self-pay

## 2023-08-24 ENCOUNTER — Ambulatory Visit (HOSPITAL_BASED_OUTPATIENT_CLINIC_OR_DEPARTMENT_OTHER)
Admission: RE | Admit: 2023-08-24 | Discharge: 2023-08-24 | Disposition: A | Discharge status: Home / Self Care | Payer: BC Managed Care – PPO | Source: Ambulatory Visit | Attending: Nurse Practitioner | Admitting: Nurse Practitioner

## 2023-08-24 ENCOUNTER — Inpatient Hospital Stay (HOSPITAL_BASED_OUTPATIENT_CLINIC_OR_DEPARTMENT_OTHER): Admission: RE | Admit: 2023-08-24 | Payer: BC Managed Care – PPO | Source: Ambulatory Visit

## 2023-08-24 DIAGNOSIS — M85852 Other specified disorders of bone density and structure, left thigh: Secondary | ICD-10-CM | POA: Diagnosis not present

## 2023-08-24 DIAGNOSIS — Z1231 Encounter for screening mammogram for malignant neoplasm of breast: Secondary | ICD-10-CM | POA: Diagnosis not present

## 2023-08-24 DIAGNOSIS — Z780 Asymptomatic menopausal state: Secondary | ICD-10-CM | POA: Diagnosis not present

## 2023-08-24 DIAGNOSIS — M8580 Other specified disorders of bone density and structure, unspecified site: Secondary | ICD-10-CM | POA: Insufficient documentation

## 2023-08-25 ENCOUNTER — Encounter: Payer: Self-pay | Admitting: Nurse Practitioner

## 2023-08-28 MED ORDER — ALENDRONATE SODIUM 70 MG PO TABS: 70.0000 mg | ORAL_TABLET | ORAL | 11 refills | Status: DC

## 2023-09-04 ENCOUNTER — Other Ambulatory Visit: Payer: Self-pay | Admitting: Nurse Practitioner

## 2023-09-04 NOTE — Telephone Encounter (Signed)
Requesting: MELOXICAM 15MG  TABLETS  Last Visit: 08/08/2023 Next Visit: Visit date not found Last Refill: 08/08/2023  Please Advise

## 2023-09-20 ENCOUNTER — Other Ambulatory Visit (HOSPITAL_COMMUNITY)
Admission: RE | Admit: 2023-09-20 | Discharge: 2023-09-20 | Disposition: A | Discharge status: Home / Self Care | Payer: Self-pay | Source: Ambulatory Visit | Attending: Oncology | Admitting: Oncology

## 2023-09-20 DIAGNOSIS — Z006 Encounter for examination for normal comparison and control in clinical research program: Secondary | ICD-10-CM | POA: Insufficient documentation

## 2023-09-30 LAB — GENECONNECT MOLECULAR SCREEN: Genetic Analysis Overall Interpretation: NEGATIVE

## 2023-10-16 ENCOUNTER — Other Ambulatory Visit: Payer: Self-pay | Admitting: Nurse Practitioner

## 2023-10-17 ENCOUNTER — Encounter: Payer: Self-pay | Admitting: Nurse Practitioner

## 2023-10-17 NOTE — Telephone Encounter (Signed)
 Requesting: MELOXICAM 15MG  TABLETS  Last Visit: 08/08/2023 Next Visit: Visit date not found Last Refill: 09/04/2023  Please Advise

## 2023-10-18 MED ORDER — ALENDRONATE SODIUM 70 MG PO TABS: 70.0000 mg | ORAL_TABLET | ORAL | 0 refills | Status: DC

## 2023-10-18 MED ORDER — ALENDRONATE SODIUM 70 MG PO TABS: 70.0000 mg | ORAL_TABLET | ORAL | 3 refills | Status: DC

## 2023-10-18 NOTE — Addendum Note (Signed)
 Addended by: Malena Peer Y on: 10/18/2023 01:27 PM   Modules accepted: Orders

## 2023-11-10 ENCOUNTER — Telehealth: Payer: Self-pay

## 2023-11-10 NOTE — Telephone Encounter (Signed)
Requesting: Meloxicam 15mg  Last Visit: 08/08/2023 Next Visit: Visit date not found Last Refill: 10/16/2023  Please Advise   Patient is requesting a 90 day supply with 3 refills

## 2023-11-11 ENCOUNTER — Other Ambulatory Visit: Payer: Self-pay | Admitting: Family

## 2023-11-11 MED ORDER — MELOXICAM 15 MG PO TABS: 15.0000 mg | ORAL_TABLET | Freq: Every day | ORAL | 1 refills | Status: DC

## 2024-05-20 ENCOUNTER — Other Ambulatory Visit: Payer: Self-pay | Admitting: Family

## 2024-05-22 NOTE — Telephone Encounter (Signed)
 Requesting: Meloxicam  15mg  Last Visit: 08/08/2023 Next Visit: 08/08/2024 Last Refill: 11/11/23  Please Advise

## 2024-05-27 ENCOUNTER — Ambulatory Visit: Admitting: Nurse Practitioner

## 2024-07-29 ENCOUNTER — Other Ambulatory Visit (HOSPITAL_BASED_OUTPATIENT_CLINIC_OR_DEPARTMENT_OTHER): Payer: Self-pay | Admitting: Nurse Practitioner

## 2024-07-29 DIAGNOSIS — Z139 Encounter for screening, unspecified: Secondary | ICD-10-CM

## 2024-08-08 ENCOUNTER — Encounter: Payer: Self-pay | Admitting: Nurse Practitioner

## 2024-08-08 ENCOUNTER — Ambulatory Visit (INDEPENDENT_AMBULATORY_CARE_PROVIDER_SITE_OTHER): Admitting: Nurse Practitioner

## 2024-08-08 ENCOUNTER — Ambulatory Visit: Payer: Self-pay | Admitting: Nurse Practitioner

## 2024-08-08 VITALS — BP 136/88 | HR 78 | Temp 96.9°F | Ht 66.0 in | Wt 164.0 lb

## 2024-08-08 DIAGNOSIS — E559 Vitamin D deficiency, unspecified: Secondary | ICD-10-CM | POA: Diagnosis not present

## 2024-08-08 DIAGNOSIS — Z0000 Encounter for general adult medical examination without abnormal findings: Secondary | ICD-10-CM | POA: Diagnosis not present

## 2024-08-08 DIAGNOSIS — G8929 Other chronic pain: Secondary | ICD-10-CM

## 2024-08-08 DIAGNOSIS — M8580 Other specified disorders of bone density and structure, unspecified site: Secondary | ICD-10-CM

## 2024-08-08 DIAGNOSIS — E785 Hyperlipidemia, unspecified: Secondary | ICD-10-CM | POA: Diagnosis not present

## 2024-08-08 DIAGNOSIS — Z23 Encounter for immunization: Secondary | ICD-10-CM

## 2024-08-08 DIAGNOSIS — R7303 Prediabetes: Secondary | ICD-10-CM

## 2024-08-08 DIAGNOSIS — M2550 Pain in unspecified joint: Secondary | ICD-10-CM

## 2024-08-08 DIAGNOSIS — M720 Palmar fascial fibromatosis [Dupuytren]: Secondary | ICD-10-CM

## 2024-08-08 DIAGNOSIS — M545 Low back pain, unspecified: Secondary | ICD-10-CM

## 2024-08-08 LAB — CBC WITH DIFFERENTIAL/PLATELET
Basophils Absolute: 0 K/uL (ref 0.0–0.1)
Basophils Relative: 0.6 % (ref 0.0–3.0)
Eosinophils Absolute: 0.1 K/uL (ref 0.0–0.7)
Eosinophils Relative: 1.9 % (ref 0.0–5.0)
HCT: 39 % (ref 36.0–46.0)
Hemoglobin: 13.5 g/dL (ref 12.0–15.0)
Lymphocytes Relative: 30.4 % (ref 12.0–46.0)
Lymphs Abs: 1.7 K/uL (ref 0.7–4.0)
MCHC: 34.6 g/dL (ref 30.0–36.0)
MCV: 89.4 fl (ref 78.0–100.0)
Monocytes Absolute: 0.3 K/uL (ref 0.1–1.0)
Monocytes Relative: 5.8 % (ref 3.0–12.0)
Neutro Abs: 3.4 K/uL (ref 1.4–7.7)
Neutrophils Relative %: 61.3 % (ref 43.0–77.0)
Platelets: 210 K/uL (ref 150.0–400.0)
RBC: 4.36 Mil/uL (ref 3.87–5.11)
RDW: 12.4 % (ref 11.5–15.5)
WBC: 5.5 K/uL (ref 4.0–10.5)

## 2024-08-08 LAB — COMPREHENSIVE METABOLIC PANEL WITH GFR
ALT: 12 U/L (ref 0–35)
AST: 18 U/L (ref 0–37)
Albumin: 4 g/dL (ref 3.5–5.2)
Alkaline Phosphatase: 54 U/L (ref 39–117)
BUN: 20 mg/dL (ref 6–23)
CO2: 27 meq/L (ref 19–32)
Calcium: 9.1 mg/dL (ref 8.4–10.5)
Chloride: 103 meq/L (ref 96–112)
Creatinine, Ser: 0.77 mg/dL (ref 0.40–1.20)
GFR: 82.39 mL/min (ref >60.00)
Glucose, Bld: 86 mg/dL (ref 70–99)
Potassium: 3.7 meq/L (ref 3.5–5.1)
Sodium: 138 meq/L (ref 135–145)
Total Bilirubin: 0.7 mg/dL (ref 0.2–1.2)
Total Protein: 6.9 g/dL (ref 6.0–8.3)

## 2024-08-08 LAB — LIPID PANEL
Cholesterol: 169 mg/dL (ref 0–200)
HDL: 56.4 mg/dL (ref >39.00)
LDL Cholesterol: 96 mg/dL (ref 0–99)
NonHDL: 112.31
Total CHOL/HDL Ratio: 3
Triglycerides: 81 mg/dL (ref 0.0–149.0)
VLDL: 16.2 mg/dL (ref 0.0–40.0)

## 2024-08-08 LAB — HEMOGLOBIN A1C: Hgb A1c MFr Bld: 5.5 % (ref 4.6–6.5)

## 2024-08-08 LAB — VITAMIN D 25 HYDROXY (VIT D DEFICIENCY, FRACTURES): VITD: 30.46 ng/mL (ref 30.00–100.00)

## 2024-08-08 MED ORDER — ALENDRONATE SODIUM 70 MG PO TABS: 70.0000 mg | ORAL_TABLET | ORAL | 3 refills | Status: AC

## 2024-08-08 MED ORDER — CYCLOBENZAPRINE HCL 5 MG PO TABS: 5.0000 mg | ORAL_TABLET | Freq: Three times a day (TID) | ORAL | 1 refills | Status: AC | PRN

## 2024-08-08 MED ORDER — MELOXICAM 15 MG PO TABS: 15.0000 mg | ORAL_TABLET | Freq: Every day | ORAL | 3 refills | Status: AC

## 2024-08-08 NOTE — Assessment & Plan Note (Signed)
 Health maintenance reviewed and updated. Discussed nutrition, exercise. Follow-up 1 year.

## 2024-08-08 NOTE — Assessment & Plan Note (Signed)
 Last dexa showed T score -2.2 with overall fracture risk 20%. She has been forgetting to take fosomax as it needs to be taken on an empty stomach. She will work on remembering to take this regularly. Continue regular exercise.

## 2024-08-08 NOTE — Assessment & Plan Note (Signed)
Chronic, stable.  Check CMP, CBC, lipid panel today. 

## 2024-08-08 NOTE — Assessment & Plan Note (Signed)
 Chronic, stable. She has been doing stretching and taking meloxicam  15mg  daily at night. Continue this regimen. Follow-up with any concerns.

## 2024-08-08 NOTE — Assessment & Plan Note (Signed)
She is taking an over-the-counter vitamin D supplement, will check levels today and adjust regimen based on results.

## 2024-08-08 NOTE — Assessment & Plan Note (Signed)
Check A1c and treat based on results. 

## 2024-08-08 NOTE — Patient Instructions (Signed)
 It was great to see you!  We are checking your labs today and will let you know the results via mychart/phone.   I am placing a referral to ortho   Let's follow-up in 1 year, sooner if you have concerns.  If a referral was placed today, you will be contacted for an appointment. Please note that routine referrals can sometimes take up to 3-4 weeks to process. Please call our office if you haven't heard anything after this time frame.  Take care,  Tinnie Harada, NP

## 2024-08-08 NOTE — Progress Notes (Signed)
 BP 136/88 (BP Location: Left Arm, Patient Position: Sitting, Cuff Size: Normal)   Pulse 78   Temp (!) 96.9 F (36.1 C)   Ht 5' 6 (1.676 m)   Wt 164 lb (74.4 kg)   LMP 01/09/2012   SpO2 99%   BMI 26.47 kg/m    Subjective:    Patient ID: Jodi Douglas Alert, female    DOB: 02-16-1961, 63 y.o.   MRN: [id number]  CC: Chief Complaint  Patient presents with   Annual Exam    With fasting labs, concerns with joint pain in left hand, tick bite in May/June, Flu Vaccine    HPI: Jodi Douglas is a 63 y.o. female presenting on 08/08/2024 for comprehensive medical examination. Current medical complaints include:left hand pain  Discussed the use of AI scribe software for clinical note transcription with the patient, who gave verbal consent to proceed.  Joint pain, particularly in her fingers, has been present since August. Her middle finger cannot straighten, with associated burning sensation and redness. There is a family history of rheumatoid arthritis. Joint pain occurs when lifting weights, which improved after reducing the weight load.    Depression and Anxiety Screen done today and results listed below:     08/08/2024    8:20 AM 08/08/2023    9:01 AM 07/04/2022    9:40 AM 06/07/2021   11:46 AM 04/28/2021   10:23 AM  Depression screen PHQ 2/9  Decreased Interest 0 0 0 0 0  Down, Depressed, Hopeless 0 0 0 0 1  PHQ - 2 Score 0 0 0 0 1  Altered sleeping 0 1 1  1   Tired, decreased energy 1 1 0  0  Change in appetite 0 1 0  0  Feeling bad or failure about yourself  0 0 0  0  Trouble concentrating 0 0 0  0  Moving slowly or fidgety/restless 0 0 0  0  Suicidal thoughts 0 0 0  0  PHQ-9 Score 1 3 1  2   Difficult doing work/chores Not difficult at all Not difficult at all   Not difficult at all      08/08/2024    8:20 AM 08/08/2023    9:01 AM 07/04/2022    9:40 AM 04/28/2021   10:23 AM  GAD 7 : Generalized Anxiety Score  Nervous, Anxious, on Edge 0 0 0 1  Control/stop worrying 0 1 0 0   Worry too much - different things 0 1 0   Trouble relaxing 0 0 0 0  Restless 0 0 0 0  Easily annoyed or irritable 0 0 0 0  Afraid - awful might happen 0 1 0 1  Total GAD 7 Score 0 3 0   Anxiety Difficulty Not difficult at all Not difficult at all  Not difficult at all    The patient does not have a history of falls. I did not complete a risk assessment for falls. A plan of care for falls was not documented.   Past Medical History:  Past Medical History:  Diagnosis Date   Abnormal EKG    had extensive negative evaluation with cardiologist    Allergy sulfa   COLONIC POLYPS, HYPERPLASTIC 09/05/2007   Qualifier: Diagnosis of  By: Myriam RN, Carissa     GERD (gastroesophageal reflux disease)    HEMORRHOIDS 07/26/2007   Qualifier: Diagnosis of  By: Charlett MD, Apolinar POUR    Hyperlipidemia    LIVER HEMANGIOMA 10/31/2007   Qualifier: Diagnosis of  By: Myriam RN, Carissa     Osteopenia    RECTAL BLEEDING 07/26/2007   Qualifier: Diagnosis of  By: Charlett MD, Apolinar POUR     Surgical History:  Past Surgical History:  Procedure Laterality Date   CESAREAN SECTION     COLONOSCOPY  2008   w/Dr.Brodie   HEMORROIDECTOMY  2008   TUBAL LIGATION      Medications:  Current Outpatient Medications on File Prior to Visit  Medication Sig   cholecalciferol (VITAMIN D3) 25 MCG (1000 UNIT) tablet Take 2,000 Units by mouth daily.   IBUPROFEN PO Take by mouth as needed.   Melatonin 5 MG TABS Take 10 tablets by mouth at bedtime.    No current facility-administered medications on file prior to visit.    Allergies:  Allergies  Allergen Reactions   Sulfonamide Derivatives Hives   Sulfa Antibiotics Hives    Social History:  Social History   Socioeconomic History   Marital status: Married    Spouse name: Not on file   Number of children: Not on file   Years of education: Not on file   Highest education level: Bachelor's degree (e.g., BA, AB, BS)  Occupational History   Not on file   Tobacco Use   Smoking status: Former    Current packs/day: 0.00    Average packs/day: 0.5 packs/day for 15.0 years (7.5 ttl pk-yrs)    Types: Cigarettes    Quit date: 10/10/1994    Years since quitting: 29.8   Smokeless tobacco: Never   Tobacco comments:    remote smoker  Vaping Use   Vaping status: Never Used  Substance and Sexual Activity   Alcohol use: Yes    Alcohol/week: 8.0 standard drinks of alcohol    Types: 3 Glasses of wine, 5 Standard drinks or equivalent per week    Comment: 2 drinks per weekends   Drug use: Never   Sexual activity: Yes    Birth control/protection: Surgical, Post-menopausal  Other Topics Concern   Not on file  Social History Narrative   Not on file   Social Drivers of Health   Financial Resource Strain: Low Risk  (08/07/2024)   Overall Financial Resource Strain (CARDIA)    Difficulty of Paying Living Expenses: Not very hard  Food Insecurity: No Food Insecurity (08/07/2024)   Hunger Vital Sign    Worried About Running Out of Food in the Last Year: Never true    Ran Out of Food in the Last Year: Never true  Transportation Needs: No Transportation Needs (08/07/2024)   PRAPARE - Administrator, Civil Service (Medical): No    Lack of Transportation (Non-Medical): No  Physical Activity: Sufficiently Active (08/07/2024)   Exercise Vital Sign    Days of Exercise per Week: 3 days    Minutes of Exercise per Session: 60 min  Stress: No Stress Concern Present (08/07/2024)   [employer] of Occupational Health - Occupational Stress Questionnaire    Feeling of Stress: Not at all  Social Connections: Moderately Integrated (08/07/2024)   Social Connection and Isolation Panel    Frequency of Communication with Friends and Family: More than three times a week    Frequency of Social Gatherings with Friends and Family: Three times a week    Attends Religious Services: 1 to 4 times per year    Active Member of Clubs or Organizations: No     Attends Banker Meetings: Not on file    Marital Status: Married  Intimate Partner Violence: Not on file   Social History   Tobacco Use  Smoking Status Former   Current packs/day: 0.00   Average packs/day: 0.5 packs/day for 15.0 years (7.5 ttl pk-yrs)   Types: Cigarettes   Quit date: 10/10/1994   Years since quitting: 29.8  Smokeless Tobacco Never  Tobacco Comments   remote smoker   Social History   Substance and Sexual Activity  Alcohol Use Yes   Alcohol/week: 8.0 standard drinks of alcohol   Types: 3 Glasses of wine, 5 Standard drinks or equivalent per week   Comment: 2 drinks per weekends    Family History:  Family History  Problem Relation Age of Onset   Cancer Mother    Heart disease Father    Arthritis Sister    Other Son        Leber Eye disease   Cancer Maternal Grandmother        liver cancer   Colon cancer Maternal Grandmother 82   Stomach cancer Maternal Grandfather    Heart disease Paternal Grandfather    Cancer Paternal Grandmother    Heart disease Paternal Uncle    Esophageal cancer Neg Hx    Rectal cancer Neg Hx     Past medical history, surgical history, medications, allergies, family history and social history reviewed with patient today and changes made to appropriate areas of the chart.   Review of Systems  Constitutional:  Positive for malaise/fatigue. Negative for fever.  HENT: Negative.    Eyes: Negative.   Respiratory: Negative.    Cardiovascular: Negative.   Gastrointestinal: Negative.   Genitourinary: Negative.   Musculoskeletal:  Positive for back pain and joint pain.  Skin: Negative.   Neurological: Negative.   Psychiatric/Behavioral: Negative.     All other ROS negative except what is listed above and in the HPI.      Objective:    BP 136/88 (BP Location: Left Arm, Patient Position: Sitting, Cuff Size: Normal)   Pulse 78   Temp (!) 96.9 F (36.1 C)   Ht 5' 6 (1.676 m)   Wt 164 lb (74.4 kg)   LMP 01/09/2012    SpO2 99%   BMI 26.47 kg/m   Wt Readings from Last 3 Encounters:  08/08/24 164 lb (74.4 kg)  08/08/23 175 lb 12.8 oz (79.7 kg)  07/04/22 164 lb 6.4 oz (74.6 kg)    Physical Exam Vitals and nursing note reviewed.  Constitutional:      General: She is not in acute distress.    Appearance: Normal appearance.  HENT:     Head: Normocephalic and atraumatic.     Right Ear: Tympanic membrane, ear canal and external ear normal.     Left Ear: Tympanic membrane, ear canal and external ear normal.     Mouth/Throat:     Mouth: Mucous membranes are moist.     Pharynx: No posterior oropharyngeal erythema.  Eyes:     Conjunctiva/sclera: Conjunctivae normal.  Cardiovascular:     Rate and Rhythm: Normal rate and regular rhythm.     Pulses: Normal pulses.     Heart sounds: Normal heart sounds.  Pulmonary:     Effort: Pulmonary effort is normal.     Breath sounds: Normal breath sounds.  Abdominal:     Palpations: Abdomen is soft.     Tenderness: There is no abdominal tenderness.  Musculoskeletal:        General: Normal range of motion.     Cervical back: Normal range of  motion and neck supple.     Right lower leg: No edema.     Left lower leg: No edema.     Comments: Cyst to base of left middle finger along with slight contracture of that finger  Lymphadenopathy:     Cervical: No cervical adenopathy.  Skin:    General: Skin is warm and dry.  Neurological:     General: No focal deficit present.     Mental Status: She is alert and oriented to person, place, and time.     Cranial Nerves: No cranial nerve deficit.     Coordination: Coordination normal.     Gait: Gait normal.  Psychiatric:        Mood and Affect: Mood normal.        Behavior: Behavior normal.        Thought Content: Thought content normal.        Judgment: Judgment normal.     Results for orders placed or performed during the hospital encounter of 09/20/23  [facility] Molecular Screen- Blood ([facility])    Collection Time: 09/20/23  2:34 PM  Result Value Ref Range   Genetic Analysis Overall Interpretation Negative    Genetic Disease Assessed      [facility] Tier One Population Screen is a screening test that analyzes 11 genes related to hereditary breast and ovarian cancer (HBOC) syndrome, Lynch syndrome, and familial hypercholesterolemia. This test only reports clinically significant pathogenic and  likely pathogenic variants but does not report variants of uncertain significance (VUS). In addition, analysis of the PMS2 gene excludes exons 11-15, which overlap with a known pseudogene (PMS2CL).    Genetic Analysis Report      No pathogenic or likely pathogenic variants were detected in the genes analyzed by this test.Genetic test results should be interpreted in the context of an individual's personal medical and family history. Alteration to medical management is NOT  recommended based solely on this result. Clinical correlation is advised.Additional Considerations- This is a screening test; individuals may still carry pathogenic or likely pathogenic variant(s) in the tested genes that are not detected by this test.-  For individuals at risk for these or other related conditions based on factors including personal or family history, diagnostic testing is recommended.- The absence of pathogenic or likely pathogenic variant(s) in the analyzed genes, while reassuring,  does not eliminate the possibility of a hereditary condition; there are other variants and genes associated with heart disease and hereditary cancer that are not included in this test.    Genes Tested See Notes    Disclaimer See Notes    Sequencing Location See Notes    Interpretation Methods and Limitations See Notes       Assessment & Plan:   Problem List Items Addressed This Visit       Musculoskeletal and Integument   Osteopenia   Last dexa showed T score -2.2 with overall fracture risk 20%. She has been forgetting to take fosomax  as it needs to be taken on an empty stomach. She will work on remembering to take this regularly. Continue regular exercise.         Other   Hyperlipidemia   Chronic, stable. Check CMP, CBC, lipid panel today.       Relevant Orders   CBC with Differential/Platelet   Comprehensive metabolic panel with GFR   Lipid panel   Vitamin D  deficiency   She is taking an over-the-counter vitamin D  supplement, will check levels today and  adjust regimen based on results.      Relevant Orders   VITAMIN D  25 Hydroxy (Vit-D Deficiency, Fractures)   Prediabetes   Check A1c and treat based on results.       Relevant Orders   Hemoglobin A1c   Routine general medical examination at a health care facility - Primary   Health maintenance reviewed and updated. Discussed nutrition, exercise. Follow-up 1 year.        Chronic midline low back pain without sciatica   Chronic, stable. She has been doing stretching and taking meloxicam  15mg  daily at night. Continue this regimen. Follow-up with any concerns.       Relevant Medications   meloxicam  (MOBIC ) 15 MG tablet   cyclobenzaprine  (FLEXERIL ) 5 MG tablet   Other Visit Diagnoses       Multiple joint pain       Continue meloxicam  15mg  daily as needed. Check ANA and RF today   Relevant Orders   ANA w/Reflex   Rheumatoid Factor     Immunization due       Flu and prevnar 20 given today   Relevant Orders   Flu vaccine trivalent PF, 6mos and older(Flulaval,Afluria,Fluarix,Fluzone) (Completed)   Pneumococcal conjugate vaccine 20-valent (Completed)     Dupuytren contracture       Noted to left middle finger with cyst at the base of the finger. Referral placed to ortho   Relevant Orders   Ambulatory referral to Orthopedic Surgery        Follow up plan: Return in about 1 year (around 08/08/2025) for CPE.   LABORATORY TESTING:  - Pap smear: up to date  IMMUNIZATIONS:   - Tdap: Tetanus vaccination status reviewed: last tetanus booster within  10 years. - Influenza: Administered today - Pneumovax: Not applicable - Prevnar: Administered today - HPV: Not applicable - Shingrix  vaccine: Up to date  SCREENING: -Mammogram: Up to date  - Colonoscopy: Up to date  - Bone Density: Not applicable   PATIENT COUNSELING:   Advised to take 1 mg of folate supplement per day if capable of pregnancy.   Sexuality: Discussed sexually transmitted diseases, partner selection, use of condoms, avoidance of unintended pregnancy  and contraceptive alternatives.   Advised to avoid cigarette smoking.  I discussed with the patient that most people either abstain from alcohol or drink within safe limits (<=14/week and <=4 drinks/occasion for males, <=7/weeks and <= 3 drinks/occasion for females) and that the risk for alcohol disorders and other health effects rises proportionally with the number of drinks per week and how often a drinker exceeds daily limits.  Discussed cessation/primary prevention of drug use and availability of treatment for abuse.   Diet: Encouraged to adjust caloric intake to maintain  or achieve ideal body weight, to reduce intake of dietary saturated fat and total fat, to limit sodium intake by avoiding high sodium foods and not adding table salt, and to maintain adequate dietary potassium and calcium preferably from fresh fruits, vegetables, and low-fat dairy products.    stressed the importance of regular exercise  Injury prevention: Discussed safety belts, safety helmets, smoke detector, smoking near bedding or upholstery.   Dental health: Discussed importance of regular tooth brushing, flossing, and dental visits.    NEXT PREVENTATIVE PHYSICAL DUE IN 1 YEAR. Return in about 1 year (around 08/08/2025) for CPE.  Cavion Faiola A Britanie Harshman

## 2024-08-09 LAB — RHEUMATOID FACTOR: Rheumatoid fact SerPl-aCnc: <10 [IU]/mL (ref <14)

## 2024-08-09 LAB — ANA W/REFLEX: Anti Nuclear Antibody (ANA): NEGATIVE

## 2024-08-27 ENCOUNTER — Ambulatory Visit (HOSPITAL_BASED_OUTPATIENT_CLINIC_OR_DEPARTMENT_OTHER)
Admission: RE | Admit: 2024-08-27 | Discharge: 2024-08-27 | Disposition: A | Discharge status: Home / Self Care | Source: Ambulatory Visit | Attending: Nurse Practitioner | Admitting: Nurse Practitioner

## 2024-08-27 ENCOUNTER — Encounter (HOSPITAL_BASED_OUTPATIENT_CLINIC_OR_DEPARTMENT_OTHER): Payer: Self-pay

## 2024-08-27 ENCOUNTER — Other Ambulatory Visit: Payer: Self-pay | Admitting: Nurse Practitioner

## 2024-08-27 DIAGNOSIS — Z1231 Encounter for screening mammogram for malignant neoplasm of breast: Secondary | ICD-10-CM | POA: Diagnosis not present

## 2024-08-27 DIAGNOSIS — Z139 Encounter for screening, unspecified: Secondary | ICD-10-CM | POA: Diagnosis not present

## 2024-08-27 NOTE — Telephone Encounter (Signed)
 Requesting: MELOXICAM  15MG  TABLETS  Last Visit: 08/08/2024 Next Visit: Visit date not found Last Refill: 08/08/2024 Qty 90 with 3 refils  Please Advise

## 2024-08-28 ENCOUNTER — Ambulatory Visit (INDEPENDENT_AMBULATORY_CARE_PROVIDER_SITE_OTHER): Admitting: Orthopedic Surgery

## 2024-08-28 DIAGNOSIS — M720 Palmar fascial fibromatosis [Dupuytren]: Secondary | ICD-10-CM | POA: Diagnosis not present

## 2024-08-28 NOTE — Progress Notes (Signed)
 Jodi Douglas - 63 y.o. female MRN [id number]  Date of birth: 02/15/1961  Office Visit Note: Visit Date: 08/28/2024 PCP: Nedra Tinnie LABOR, NP Referred by: Nedra Tinnie LABOR, NP  Subjective: No chief complaint on file.  HPI: Jodi Douglas is a pleasant 63 y.o. female who presents today for evaluation of left hand long finger contracture that is been present now for multiple months.  She states that she does have a palmar cord as well.  Is unsure of any prior family history of Dupuytren's disease.  Has minimal pain currently, does not have significant functional limitations.  Pertinent ROS were reviewed with the patient and found to be negative unless otherwise specified above in HPI.   Visit Reason: left hand long finger Dupuytren's contracture Duration of symptoms: 4 months Hand dominance: right Occupation: accountant Diabetic: No Smoking: No Heart/Lung History: Blood Thinners:  none  Prior Testing/EMG: none Injections (Date): none Treatments: none Prior Surgery: none  Assessment & Plan: Visit Diagnoses:  1. Dupuytren's contracture of hand     Plan: Extensive discussion was had with the patient today regarding her left hand long finger contracture.  This does appear to be consistent with progressive Dupuytren's disease of the palm.  She does have a cordlike structure in line with the long finger with a slight contracture to the MCP region.  We discussed the underlying etiology and pathophysiology of this condition as well as the possibility for progression of her contracture.  Given that her contracture is currently mild and passively correctable, we can continue with observation for the time being.  I did explain that should the contracture worsen or persist, or become more fixed in nature, she should return to me in the future for repeat discussion.  At that juncture, we could discuss potential collagenase injection versus surgical intervention.  Patient expressed full  understanding.  Will return as needed.  Follow-up: No follow-ups on file.   Meds & Orders: No orders of the defined types were placed in this encounter.  No orders of the defined types were placed in this encounter.    Procedures: No procedures performed      Clinical History: No specialty comments available.  She reports that she quit smoking about 29 years ago. Her smoking use included cigarettes. She has a 7.5 pack-year smoking history. She has never used smokeless tobacco.  Recent Labs    08/08/24 0856  HGBA1C 5.5    Objective:   Vital Signs: LMP 01/09/2012   Physical Exam  Gen: Well-appearing, in no acute distress; non-toxic CV: Regular Rate. Well-perfused. Warm.  Resp: Breathing unlabored on room air; no wheezing. Psych: Fluid speech in conversation; appropriate affect; normal thought process  Ortho Exam Left hand: - Palpable cord in line with the long finger, no significant tenderness, MCP contracture approximate 15 degrees, passively correctable, tabletop test is mildly positive - Able to form composite fist without restriction, sensation intact distally, hand remains warm well-perfused   Imaging: No results found.  Past Medical/Family/Surgical/Social History: Medications & Allergies reviewed per EMR, new medications updated. Patient Active Problem List   Diagnosis Date Noted   Prediabetes 08/08/2023   Routine general medical examination at a health care facility 08/08/2023   Chronic midline low back pain without sciatica 08/08/2023   Bunion of great toe 08/08/2023   Vitamin D  deficiency 05/12/2021   IFG (impaired fasting glucose) 05/12/2021   Osteopenia 05/16/2013   Nonspecific abnormal electrocardiogram (ECG) (EKG) 05/16/2013   Hyperlipidemia 05/16/2013  Past Medical History:  Diagnosis Date   Abnormal EKG    had extensive negative evaluation with cardiologist    Allergy sulfa   COLONIC POLYPS, HYPERPLASTIC 09/05/2007   Qualifier: Diagnosis of   By: Myriam RN, Carissa     GERD (gastroesophageal reflux disease)    HEMORRHOIDS 07/26/2007   Qualifier: Diagnosis of  By: Charlett MD, Apolinar POUR    Hyperlipidemia    LIVER HEMANGIOMA 10/31/2007   Qualifier: Diagnosis of  By: Myriam RN, Carissa     Osteopenia    RECTAL BLEEDING 07/26/2007   Qualifier: Diagnosis of  By: Charlett MD, Apolinar POUR    Family History  Problem Relation Age of Onset   Cancer Mother    Heart disease Father    Arthritis Sister    Other Son        Leber Eye disease   Cancer Maternal Grandmother        liver cancer   Colon cancer Maternal Grandmother 82   Stomach cancer Maternal Grandfather    Heart disease Paternal Grandfather    Cancer Paternal Grandmother    Heart disease Paternal Uncle    Esophageal cancer Neg Hx    Rectal cancer Neg Hx    Past Surgical History:  Procedure Laterality Date   CESAREAN SECTION     COLONOSCOPY  2008   w/Dr.Brodie   HEMORROIDECTOMY  2008   TUBAL LIGATION     Social History   Occupational History   Not on file  Tobacco Use   Smoking status: Former    Current packs/day: 0.00    Average packs/day: 0.5 packs/day for 15.0 years (7.5 ttl pk-yrs)    Types: Cigarettes    Quit date: 10/10/1994    Years since quitting: 29.9   Smokeless tobacco: Never   Tobacco comments:    remote smoker  Vaping Use   Vaping status: Never Used  Substance and Sexual Activity   Alcohol use: Yes    Alcohol/week: 8.0 standard drinks of alcohol    Types: 3 Glasses of wine, 5 Standard drinks or equivalent per week    Comment: 2 drinks per weekends   Drug use: Never   Sexual activity: Yes    Birth control/protection: Surgical, Post-menopausal    Herma Uballe Estela) Tatia Petrucci, M.D. [facility], Hand Surgery

## 2024-09-02 ENCOUNTER — Ambulatory Visit: Payer: Self-pay | Admitting: Nurse Practitioner
